# Patient Record
Sex: Female | Born: 1970
Health system: Southern US, Community
[De-identification: ages and names within clinical notes are randomized; demographics above are authoritative.]

## PROBLEM LIST (undated history)

## (undated) ENCOUNTER — Inpatient Hospital Stay (HOSPITAL_COMMUNITY): Payer: Self-pay | Admitting: Obstetrics and Gynecology

## (undated) DIAGNOSIS — R51 Headache: Secondary | ICD-10-CM

## (undated) DIAGNOSIS — Z79890 Hormone replacement therapy (postmenopausal): Secondary | ICD-10-CM

## (undated) DIAGNOSIS — R519 Headache, unspecified: Secondary | ICD-10-CM

## (undated) DIAGNOSIS — E119 Type 2 diabetes mellitus without complications: Secondary | ICD-10-CM

## (undated) DIAGNOSIS — Z87442 Personal history of urinary calculi: Secondary | ICD-10-CM

## (undated) DIAGNOSIS — E7800 Pure hypercholesterolemia, unspecified: Secondary | ICD-10-CM

## (undated) DIAGNOSIS — E039 Hypothyroidism, unspecified: Secondary | ICD-10-CM

## (undated) DIAGNOSIS — R7303 Prediabetes: Secondary | ICD-10-CM

## (undated) HISTORY — PX: BREAST SURGERY: SHX581

## (undated) HISTORY — DX: Pure hypercholesterolemia, unspecified: E78.00

## (undated) HISTORY — DX: Prediabetes: R73.03

---

## 2002-07-16 ENCOUNTER — Emergency Department (HOSPITAL_COMMUNITY): Admission: EM | Admit: 2002-07-16 | Discharge: 2002-07-17 | Payer: Self-pay | Admitting: Emergency Medicine

## 2005-01-30 ENCOUNTER — Encounter: Admission: RE | Admit: 2005-01-30 | Discharge: 2005-01-30 | Payer: Self-pay | Admitting: Occupational Medicine

## 2008-04-02 ENCOUNTER — Emergency Department (HOSPITAL_COMMUNITY): Admission: EM | Admit: 2008-04-02 | Discharge: 2008-04-02 | Payer: Self-pay | Admitting: Emergency Medicine

## 2008-12-24 ENCOUNTER — Emergency Department (HOSPITAL_COMMUNITY): Admission: EM | Admit: 2008-12-24 | Discharge: 2008-12-24 | Payer: Self-pay | Admitting: Family Medicine

## 2013-12-19 ENCOUNTER — Encounter (HOSPITAL_COMMUNITY): Payer: Self-pay | Admitting: Emergency Medicine

## 2013-12-19 ENCOUNTER — Emergency Department (HOSPITAL_COMMUNITY)
Admission: EM | Admit: 2013-12-19 | Discharge: 2013-12-19 | Disposition: A | Discharge status: Home / Self Care | Payer: BC Managed Care – PPO | Attending: Emergency Medicine | Admitting: Emergency Medicine

## 2013-12-19 DIAGNOSIS — R197 Diarrhea, unspecified: Secondary | ICD-10-CM | POA: Insufficient documentation

## 2013-12-19 DIAGNOSIS — Z3202 Encounter for pregnancy test, result negative: Secondary | ICD-10-CM | POA: Insufficient documentation

## 2013-12-19 DIAGNOSIS — R1110 Vomiting, unspecified: Secondary | ICD-10-CM

## 2013-12-19 DIAGNOSIS — R112 Nausea with vomiting, unspecified: Secondary | ICD-10-CM | POA: Insufficient documentation

## 2013-12-19 LAB — CBC WITH DIFFERENTIAL/PLATELET
Eosinophils Absolute: 0.1 10*3/uL (ref 0.0–0.7)
Eosinophils Relative: 1 % (ref 0–5)
HCT: 42.2 % (ref 36.0–46.0)
Hemoglobin: 14 g/dL (ref 12.0–15.0)
Lymphs Abs: 0.7 10*3/uL (ref 0.7–4.0)
MCH: 25.6 pg — ABNORMAL LOW (ref 26.0–34.0)
MCV: 77.3 fL — ABNORMAL LOW (ref 78.0–100.0)
Monocytes Absolute: 0.6 10*3/uL (ref 0.1–1.0)
Monocytes Relative: 5 % (ref 3–12)
RBC: 5.46 MIL/uL — ABNORMAL HIGH (ref 3.87–5.11)

## 2013-12-19 LAB — URINALYSIS, ROUTINE W REFLEX MICROSCOPIC
Ketones, ur: NEGATIVE mg/dL
Nitrite: NEGATIVE
pH: 8 (ref 5.0–8.0)

## 2013-12-19 LAB — COMPREHENSIVE METABOLIC PANEL
Alkaline Phosphatase: 71 U/L (ref 39–117)
BUN: 19 mg/dL (ref 6–23)
Calcium: 9.5 mg/dL (ref 8.4–10.5)
Creatinine, Ser: 0.79 mg/dL (ref 0.50–1.10)
GFR calc Af Amer: >90 mL/min (ref >90)
Glucose, Bld: 113 mg/dL — ABNORMAL HIGH (ref 70–99)
Potassium: 4.3 mEq/L (ref 3.5–5.1)
Total Protein: 7.6 g/dL (ref 6.0–8.3)

## 2013-12-19 LAB — URINE MICROSCOPIC-ADD ON

## 2013-12-19 MED ORDER — KETOROLAC TROMETHAMINE 30 MG/ML IJ SOLN
30.0000 mg | Freq: Once | INTRAMUSCULAR | Status: AC
  Administered 2013-12-19: 30 mg via INTRAMUSCULAR
  Filled 2013-12-19: qty 1

## 2013-12-19 MED ORDER — ONDANSETRON HCL 4 MG/2ML IJ SOLN: 4.0000 mg | Freq: Once | INTRAMUSCULAR | Status: DC

## 2013-12-19 MED ORDER — ONDANSETRON 4 MG PO TBDP
4.0000 mg | ORAL_TABLET | Freq: Once | ORAL | Status: AC
  Administered 2013-12-19: 4 mg via ORAL
  Filled 2013-12-19: qty 1

## 2013-12-19 MED ORDER — ONDANSETRON 4 MG PO TBDP: 4.0000 mg | ORAL_TABLET | Freq: Three times a day (TID) | ORAL | Status: DC | PRN

## 2013-12-19 NOTE — ED Provider Notes (Signed)
CSN: [id number]     Arrival date & time 12/19/13  0701 History   First MD Initiated Contact with Patient 12/19/13 [phone]     Chief Complaint  Patient presents with  . Nausea  . Emesis  . Diarrhea   (Consider location/radiation/quality/duration/timing/severity/associated sxs/prior Treatment) HPI  This a 42 year old female with no significant past medical history who presents with nausea, vomiting, and diarrhea. Onset of symptoms was this morning at 2 AM. Patient denies any abdominal pain. She denies any bloody stools. Emesis is nonbilious, nonbloody. Patient has had no known sick contacts. She denies fevers. She has had one week of cough and congestion.  Patient was concerned that she was unable to tolerate any fluids.  History reviewed. No pertinent past medical history. History reviewed. No pertinent past surgical history. No family history on file. History  Substance Use Topics  . Smoking status: Never Smoker   . Smokeless tobacco: Not on file  . Alcohol Use: Yes     Comment: social   OB History   Grav Para Term Preterm Abortions TAB SAB Ect Mult Living                 Review of Systems  Constitutional: Negative for fever.  Respiratory: Negative for cough, chest tightness and shortness of breath.   Cardiovascular: Negative for chest pain.  Gastrointestinal: Positive for nausea, vomiting and diarrhea. Negative for abdominal pain.  Genitourinary: Negative for dysuria.  Skin: Negative for rash.  All other systems reviewed and are negative.    Allergies  Review of patient's allergies indicates no known allergies.  Home Medications   Current Outpatient Rx  Name  Route  Sig  Dispense  Refill  . ibuprofen (ADVIL,MOTRIN) 200 MG tablet   Oral   Take 400 mg by mouth every 6 (six) hours as needed for fever or mild pain.         Marland Kitchen ondansetron (ZOFRAN-ODT) 4 MG disintegrating tablet   Oral   Take 1 tablet (4 mg total) by mouth every 8 (eight) hours as needed for nausea or  vomiting.   20 tablet   0    BP 113/73  Pulse 88  Temp(Src) 97.7 F (36.5 C) (Oral)  Resp 16  SpO2 100%  LMP 12/11/2013 Physical Exam  Nursing note and vitals reviewed. Constitutional: She is oriented to person, place, and time. No distress.  Ill-appearing but nontoxic  HENT:  Head: Normocephalic and atraumatic.  Mouth/Throat: Oropharynx is clear and moist.  Eyes: Pupils are equal, round, and reactive to light.  Neck: Neck supple.  Cardiovascular: Normal rate, regular rhythm and normal heart sounds.   No murmur heard. Pulmonary/Chest: Effort normal and breath sounds normal. No respiratory distress.  Abdominal: Soft. Bowel sounds are normal. There is no tenderness. There is no rebound and no guarding.  Neurological: She is alert and oriented to person, place, and time.  Skin: Skin is warm and dry.  Psychiatric: She has a normal mood and affect.    ED Course  Procedures (including critical care time) Labs Review Labs Reviewed  CBC WITH DIFFERENTIAL - Abnormal; Notable for the following:    WBC 11.5 (*)    RBC 5.46 (*)    MCV 77.3 (*)    MCH 25.6 (*)    Neutrophils Relative % 87 (*)    Neutro Abs 10.1 (*)    Lymphocytes Relative 6 (*)    All other components within normal limits  COMPREHENSIVE METABOLIC PANEL - Abnormal; Notable for the  following:    Glucose, Bld 113 (*)    All other components within normal limits  URINALYSIS, ROUTINE W REFLEX MICROSCOPIC - Abnormal; Notable for the following:    APPearance CLOUDY (*)    Hgb urine dipstick LARGE (*)    Protein, ur 30 (*)    Leukocytes, UA SMALL (*)    All other components within normal limits  URINE MICROSCOPIC-ADD ON  POCT PREGNANCY, URINE   Imaging Review No results found.  EKG Interpretation   None       MDM   1. Vomiting and diarrhea    Patient presents with vomiting and diarrhea onset this morning. She is ill-appearing but nontoxic. Lab work from triage has been reviewed and is largely  unremarkable. Urinalysis without evidence of UTI. Feel the patient symptoms are likely secondary to viral syndrome. Patient was given Zofran and was able to orally hydrate without vomiting. Discussed the patient precautions regarding dehydration.  Abdominal exam is benign.  After history, exam, and medical workup I feel the patient has been appropriately medically screened and is safe for discharge home. Pertinent diagnoses were discussed with the patient. Patient was given return precautions.     Shon Baton, MD 12/19/13 1101

## 2013-12-19 NOTE — ED Notes (Signed)
Reminded pt of the need for a urine sample and encouraged fluids. Pt now c/o  Headache.

## 2013-12-19 NOTE — ED Notes (Signed)
Pt aware of the need for a urine sample. 

## 2013-12-19 NOTE — ED Notes (Signed)
This morning woke about 0200 with vomiting and diarrhea, has vomited about every hour since.  Diarrhea x 2

## 2016-11-29 ENCOUNTER — Emergency Department (HOSPITAL_BASED_OUTPATIENT_CLINIC_OR_DEPARTMENT_OTHER): Payer: 59

## 2016-11-29 ENCOUNTER — Inpatient Hospital Stay (HOSPITAL_BASED_OUTPATIENT_CLINIC_OR_DEPARTMENT_OTHER)
Admission: EM | Admit: 2016-11-29 | Discharge: 2016-11-30 | Disposition: A | Discharge status: Home / Self Care | Payer: 59 | Attending: Obstetrics and Gynecology | Admitting: Obstetrics and Gynecology

## 2016-11-29 ENCOUNTER — Encounter (HOSPITAL_BASED_OUTPATIENT_CLINIC_OR_DEPARTMENT_OTHER): Payer: Self-pay | Admitting: *Deleted

## 2016-11-29 DIAGNOSIS — N839 Noninflammatory disorder of ovary, fallopian tube and broad ligament, unspecified: Secondary | ICD-10-CM | POA: Insufficient documentation

## 2016-11-29 DIAGNOSIS — R1900 Intra-abdominal and pelvic swelling, mass and lump, unspecified site: Secondary | ICD-10-CM | POA: Diagnosis not present

## 2016-11-29 DIAGNOSIS — R102 Pelvic and perineal pain: Secondary | ICD-10-CM | POA: Diagnosis present

## 2016-11-29 DIAGNOSIS — R109 Unspecified abdominal pain: Secondary | ICD-10-CM

## 2016-11-29 DIAGNOSIS — N838 Other noninflammatory disorders of ovary, fallopian tube and broad ligament: Secondary | ICD-10-CM

## 2016-11-29 LAB — CBC WITH DIFFERENTIAL/PLATELET
BASOS ABS: 0 10*3/uL (ref 0.0–0.1)
Basophils Relative: 0 %
EOS PCT: 2 %
Eosinophils Absolute: 0.3 10*3/uL (ref 0.0–0.7)
HCT: 39.6 % (ref 36.0–46.0)
Hemoglobin: 13.1 g/dL (ref 12.0–15.0)
LYMPHS PCT: 26 %
Lymphs Abs: 2.9 10*3/uL (ref 0.7–4.0)
MCH: 26.4 pg (ref 26.0–34.0)
MCHC: 33.1 g/dL (ref 30.0–36.0)
MCV: 79.8 fL (ref 78.0–100.0)
Monocytes Absolute: 0.9 10*3/uL (ref 0.1–1.0)
Monocytes Relative: 8 %
NEUTROS ABS: 6.9 10*3/uL (ref 1.7–7.7)
Neutrophils Relative %: 64 %
PLATELETS: 388 10*3/uL (ref 150–400)
RBC: 4.96 MIL/uL (ref 3.87–5.11)
RDW: 14.1 % (ref 11.5–15.5)
WBC: 11 10*3/uL — AB (ref 4.0–10.5)

## 2016-11-29 LAB — BASIC METABOLIC PANEL
ANION GAP: 13 (ref 5–15)
BUN: 23 mg/dL — ABNORMAL HIGH (ref 6–20)
CO2: 21 mmol/L — ABNORMAL LOW (ref 22–32)
Calcium: 9.6 mg/dL (ref 8.9–10.3)
Chloride: 103 mmol/L (ref 101–111)
Creatinine, Ser: 0.98 mg/dL (ref 0.44–1.00)
GFR calc Af Amer: >60 mL/min (ref >60)
GLUCOSE: 113 mg/dL — AB (ref 65–99)
POTASSIUM: 4.4 mmol/L (ref 3.5–5.1)
Sodium: 137 mmol/L (ref 135–145)

## 2016-11-29 LAB — URINALYSIS, ROUTINE W REFLEX MICROSCOPIC
Bilirubin Urine: NEGATIVE
Glucose, UA: NEGATIVE mg/dL
HGB URINE DIPSTICK: NEGATIVE
Ketones, ur: NEGATIVE mg/dL
LEUKOCYTES UA: NEGATIVE
NITRITE: NEGATIVE
PROTEIN: NEGATIVE mg/dL
Specific Gravity, Urine: 1.024 (ref 1.005–1.030)
pH: 6 (ref 5.0–8.0)

## 2016-11-29 LAB — PREGNANCY, URINE: PREG TEST UR: NEGATIVE

## 2016-11-29 MED ORDER — MORPHINE SULFATE (PF) 4 MG/ML IV SOLN
INTRAVENOUS | Status: AC
  Administered 2016-11-29: 4 mg
  Filled 2016-11-29: qty 1

## 2016-11-29 MED ORDER — KETOROLAC TROMETHAMINE 30 MG/ML IJ SOLN
INTRAMUSCULAR | Status: AC
  Administered 2016-11-29: 30 mg via INTRAVENOUS
  Filled 2016-11-29: qty 1

## 2016-11-29 MED ORDER — KETOROLAC TROMETHAMINE 30 MG/ML IJ SOLN
30.0000 mg | Freq: Once | INTRAMUSCULAR | Status: AC
  Administered 2016-11-29: 30 mg via INTRAVENOUS

## 2016-11-29 MED ORDER — SODIUM CHLORIDE 0.9 % IV SOLN
INTRAVENOUS | Status: DC
  Administered 2016-11-29: via INTRAVENOUS

## 2016-11-29 MED ORDER — ONDANSETRON HCL 4 MG/2ML IJ SOLN
INTRAMUSCULAR | Status: AC
  Administered 2016-11-29: 4 mg
  Filled 2016-11-29: qty 2

## 2016-11-29 MED ORDER — HYDROMORPHONE HCL 1 MG/ML IJ SOLN
1.0000 mg | Freq: Once | INTRAMUSCULAR | Status: AC
  Administered 2016-11-29: 1 mg via INTRAVENOUS
  Filled 2016-11-29: qty 1

## 2016-11-29 NOTE — ED Triage Notes (Signed)
Pt c/o sudden onset of  left flank pain x 30 mins ago.

## 2016-11-29 NOTE — ED Provider Notes (Signed)
By signing my name below, I, Diana Long, attest that this documentation has been prepared under the direction and in the presence of Biddle, DO . Electronically Signed: Dolores Long, Scribe. 11/29/2016. 11:05 PM.  TIME SEEN: 11:29PM  CHIEF COMPLAINT: Flank Pain  HPI:   Diana Long is a 45 y.o. female who presents to the Emergency Department complaining of sudden-onset constant lower left sided back pain beginning earlier today that started just prior to arrival. Described a sharp, severe in nature and radiates into the left pelvic region.. Pt has not tried any OTC pain killers for her symptoms. No modifying factors indicated. She reports associated nausea. Pt denies any vomiting, diarrhea, dysuria, hematuria, vaginal bleeding, vaginal discharge or recent injury to her back. Has never had similar symptoms. No history of kidney stones.   ROS: See HPI Constitutional: no fever  Eyes: no drainage  ENT: no runny nose   Cardiovascular:  no chest pain  Resp: no SOB  GI: no vomiting. no diarrhea.  GU: flank pain. no dysuria. no hematuria. no vaginal bleeding. no vaginal discharge. Integumentary: no rash  Allergy: no hives  Musculoskeletal: no leg swelling  Neurological: no slurred speech ROS otherwise negative  PAST MEDICAL HISTORY/PAST SURGICAL HISTORY:  History reviewed. No pertinent past medical history.  MEDICATIONS:  Prior to Admission medications   Medication Sig Start Date End Date Taking? Authorizing Provider  ferrous sulfate 325 (65 FE) MG tablet Take 325 mg by mouth daily with breakfast.   Yes Historical Provider, MD  ibuprofen (ADVIL,MOTRIN) 200 MG tablet Take 400 mg by mouth every 6 (six) hours as needed for fever or mild pain.    Historical Provider, MD  ondansetron (ZOFRAN-ODT) 4 MG disintegrating tablet Take 1 tablet (4 mg total) by mouth every 8 (eight) hours as needed for nausea or vomiting. 12/19/13   Merryl Hacker, MD    ALLERGIES:  No Known  Allergies  SOCIAL HISTORY:  Social History  Substance Use Topics  . Smoking status: Never Smoker  . Smokeless tobacco: Not on file  . Alcohol use Yes     Comment: social    FAMILY HISTORY: No family history on file.  EXAM: BP 125/80 (BP Location: Right Arm)   Pulse 81   Temp 97.7 F (36.5 C) (Oral)   Resp 22   Ht 5\' 5"  (1.651 m)   Wt 200 lb (90.7 kg)   LMP 11/09/2016   SpO2 100%   BMI 33.28 kg/m  CONSTITUTIONAL: Alert and oriented and responds appropriately to questions. Well-nourished. Appears uncomfortable.  Afebrile. Nontoxic. HEAD: Normocephalic EYES: Conjunctivae clear, PERRL, EOMI ENT: normal nose; no rhinorrhea; moist mucous membranes NECK: Supple, no meningismus, no nuchal rigidity, no LAD  CARD: RRR; S1 and S2 appreciated; no murmurs, no clicks, no rubs, no gallops RESP: Normal chest excursion without splinting or tachypnea; breath sounds clear and equal bilaterally; no wheezes, no rhonchi, no rales, no hypoxia or respiratory distress, speaking full sentences ABD/GI: Normal bowel sounds; non-distended; soft, non-tender, no rebound, no guarding, no peritoneal signs, no hepatosplenomegaly GU:  Normal external genitalia. No lesions, rashes noted. Patient has no vaginal bleeding on exam. No significant vaginal discharge.  No right adnexal tenderness, mass or fullness, no cervical motion tenderness. Patient does have left adnexal tenderness and has fullness on the side but exam is very limited due to patient's pain. Cervix is not appear friable.  Cervix is closed.  Chaperone present for exam. BACK:  The back appears normal and is  non-tender to palpation, there is no CVA tenderness EXT: Normal ROM in all joints; non-tender to palpation; no edema; normal capillary refill; no cyanosis, no calf tenderness or swelling    SKIN: Normal color for age and race; warm; no rash NEURO: Moves all extremities equally, sensation to light touch intact diffusely, cranial nerves II through XII  intact, normal speech PSYCH: The patient's mood and manner are appropriate. Grooming and personal hygiene are appropriate.  MEDICAL DECISION MAKING: Patient here with sudden onset left flank pain that radiates into her left lower quadrant. Initial concern was for kidney stone. CT scan obtained in triage. Urine shows no blood or sign of infection. Pregnancy test negative. No improvement with IV morphine. We'll give Toradol, Dilaudid.  ED PROGRESS: CT scan shows a large midline pelvic mass measuring 12 cm that may be arising from the left ovary. I am not concerned for ovarian torsion. Will perform pelvic exam with cultures. She states she has never been sexually active but has had a pelvic exam before. No history of pregnancies, STDs. No history of uterine, ovarian cancer for herself or family. Does report family history of breast cancer. States her OB/GYN is Dr. Melba Coon with [facility].  CT scan shows nonobstructing left nephrolithiasis but no ureterolithiasis, hydronephrosis.   11:40 PM  Spoke with Dr. Ilda Basset at [facility] for an accepting physician as patient needs to come emergency traffic for evaluation.  Page has been placed to her OBGYN with [id number], Dr. Melba Coon.  EMS notified.  11:55 AM  Spoke with Dr. Melba Coon.  She agrees to accept the patient in transfer to MAU for an emergent [place].  Pt is NPO.  Patient and mother updated with plan.     I reviewed all nursing notes, vitals, pertinent old records, EKGs, labs, imaging (as available).   I personally performed the services described in this documentation, which was scribed in my presence. The recorded information has been reviewed and is accurate.     Baker, DO 11/30/16 0002

## 2016-11-29 NOTE — ED Notes (Signed)
Patient to be transferred to [facility].

## 2016-11-30 ENCOUNTER — Inpatient Hospital Stay (HOSPITAL_COMMUNITY): Payer: 59

## 2016-11-30 ENCOUNTER — Telehealth: Payer: Self-pay

## 2016-11-30 ENCOUNTER — Encounter (HOSPITAL_COMMUNITY): Payer: Self-pay | Admitting: *Deleted

## 2016-11-30 DIAGNOSIS — R1900 Intra-abdominal and pelvic swelling, mass and lump, unspecified site: Secondary | ICD-10-CM

## 2016-11-30 DIAGNOSIS — N839 Noninflammatory disorder of ovary, fallopian tube and broad ligament, unspecified: Secondary | ICD-10-CM | POA: Diagnosis not present

## 2016-11-30 LAB — WET PREP, GENITAL
Clue Cells Wet Prep HPF POC: NONE SEEN
Sperm: NONE SEEN
Trich, Wet Prep: NONE SEEN
WBC WET PREP: NONE SEEN
Yeast Wet Prep HPF POC: NONE SEEN

## 2016-11-30 LAB — GC/CHLAMYDIA PROBE AMP (~~LOC~~) NOT AT ARMC
Chlamydia: NEGATIVE
Neisseria Gonorrhea: NEGATIVE

## 2016-11-30 MED ORDER — OXYCODONE-ACETAMINOPHEN 5-325 MG PO TABS: 1.0000 | ORAL_TABLET | ORAL | 0 refills | Status: DC | PRN

## 2016-11-30 MED ORDER — PROMETHAZINE HCL 25 MG PO TABS: 12.5000 mg | ORAL_TABLET | Freq: Four times a day (QID) | ORAL | 0 refills | Status: DC | PRN

## 2016-11-30 MED ORDER — PROMETHAZINE HCL 25 MG PO TABS
25.0000 mg | ORAL_TABLET | Freq: Once | ORAL | Status: AC
  Administered 2016-11-30: 25 mg via ORAL
  Filled 2016-11-30: qty 1

## 2016-11-30 MED ORDER — OXYCODONE-ACETAMINOPHEN 5-325 MG PO TABS
2.0000 | ORAL_TABLET | Freq: Once | ORAL | Status: AC
  Administered 2016-11-30: 2 via ORAL
  Filled 2016-11-30: qty 2

## 2016-11-30 NOTE — Telephone Encounter (Signed)
Notified pt of appt with Dr Denman George for 12/10/16 at 9:30am, records requested from Dr Tereso Newcomer,

## 2016-11-30 NOTE — ED Notes (Signed)
Patient transferred to EMS  - patient with decreased O2 sats intermittently to 90% - the patient then took deep breaths and o2 sats to 94% - EMS aware.

## 2016-11-30 NOTE — MAU Provider Note (Signed)
History     CSN: [id number]  Arrival date and time: 11/29/16 2205   None     Chief Complaint  Patient presents with  . Pelvic Pain   Pelvic Pain  The patient's primary symptoms include pelvic pain. This is a new problem. The current episode started yesterday. The problem occurs constantly. The problem has been gradually improving. Pain severity now: 2/10 currently  The problem affects both sides. She is not pregnant. Associated symptoms include abdominal pain, flank pain, nausea and vomiting. Pertinent negatives include no chills, constipation, diarrhea or fever. The vaginal discharge was normal. There has been no bleeding. Nothing aggravates the symptoms. She has tried nothing for the symptoms. She is not sexually active. She uses abstinence for contraception. Menstrual history: LMP: 11/09/16    Past Medical History:  Diagnosis Date  . Medical history non-contributory     Past Surgical History:  Procedure Laterality Date  . NO PAST SURGERIES      History reviewed. No pertinent family history.  Social History  Substance Use Topics  . Smoking status: Never Smoker  . Smokeless tobacco: Never Used  . Alcohol use Yes     Comment: social    Allergies: No Known Allergies  Prescriptions Prior to Admission  Medication Sig Dispense Refill Last Dose  . ferrous sulfate 325 (65 FE) MG tablet Take 325 mg by mouth daily with breakfast.     . ibuprofen (ADVIL,MOTRIN) 200 MG tablet Take 400 mg by mouth every 6 (six) hours as needed for fever or mild pain.   12/18/2013 at Unknown time  . ondansetron (ZOFRAN-ODT) 4 MG disintegrating tablet Take 1 tablet (4 mg total) by mouth every 8 (eight) hours as needed for nausea or vomiting. 20 tablet 0     Review of Systems  Constitutional: Negative for chills and fever.  Gastrointestinal: Positive for abdominal pain, nausea and vomiting. Negative for constipation and diarrhea.  Genitourinary: Positive for flank pain and pelvic pain.   Physical  Exam   Blood pressure 127/70, pulse 73, temperature 98.7 F (37.1 C), temperature source Oral, resp. rate 17, height 5\' 5"  (1.651 m), weight 200 lb (90.7 kg), last menstrual period 11/09/2016, SpO2 96 %.  Physical Exam  Nursing note and vitals reviewed. Constitutional: She is oriented to person, place, and time. She appears well-developed and well-nourished. No distress.  HENT:  Head: Normocephalic.  Cardiovascular: Normal rate.   Respiratory: Effort normal.  GI: Soft. She exhibits mass. There is no tenderness. There is no rebound.  Large mass palpable to the left of the umbilicus.   Neurological: She is alert and oriented to person, place, and time.  Skin: Skin is warm and dry.  Psychiatric: She has a normal mood and affect.   Results for orders placed or performed during the hospital encounter of 11/29/16 (from the past 24 hour(s))  Pregnancy, urine     Status: None   Collection Time: 11/29/16 10:15 PM  Result Value Ref Range   Preg Test, Ur NEGATIVE NEGATIVE  Urinalysis, Routine w reflex microscopic     Status: Abnormal   Collection Time: 11/29/16 10:15 PM  Result Value Ref Range   Color, Urine YELLOW YELLOW   APPearance CLOUDY (A) CLEAR   Specific Gravity, Urine 1.024 1.005 - 1.030   pH 6.0 5.0 - 8.0   Glucose, UA NEGATIVE NEGATIVE mg/dL   Hgb urine dipstick NEGATIVE NEGATIVE   Bilirubin Urine NEGATIVE NEGATIVE   Ketones, ur NEGATIVE NEGATIVE mg/dL   Protein, ur NEGATIVE  NEGATIVE mg/dL   Nitrite NEGATIVE NEGATIVE   Leukocytes, UA NEGATIVE NEGATIVE  CBC with Differential     Status: Abnormal   Collection Time: 11/29/16 11:25 PM  Result Value Ref Range   WBC 11.0 (H) 4.0 - 10.5 K/uL   RBC 4.96 3.87 - 5.11 MIL/uL   Hemoglobin 13.1 12.0 - 15.0 g/dL   HCT 39.6 36.0 - 46.0 %   MCV 79.8 78.0 - 100.0 fL   MCH 26.4 26.0 - 34.0 pg   MCHC 33.1 30.0 - 36.0 g/dL   RDW 14.1 11.5 - 15.5 %   Platelets 388 150 - 400 K/uL   Neutrophils Relative % 64 %   Neutro Abs 6.9 1.7 - 7.7  K/uL   Lymphocytes Relative 26 %   Lymphs Abs 2.9 0.7 - 4.0 K/uL   Monocytes Relative 8 %   Monocytes Absolute 0.9 0.1 - 1.0 K/uL   Eosinophils Relative 2 %   Eosinophils Absolute 0.3 0.0 - 0.7 K/uL   Basophils Relative 0 %   Basophils Absolute 0.0 0.0 - 0.1 K/uL  Basic metabolic panel     Status: Abnormal   Collection Time: 11/29/16 11:25 PM  Result Value Ref Range   Sodium 137 135 - 145 mmol/L   Potassium 4.4 3.5 - 5.1 mmol/L   Chloride 103 101 - 111 mmol/L   CO2 21 (L) 22 - 32 mmol/L   Glucose, Bld 113 (H) 65 - 99 mg/dL   BUN 23 (H) 6 - 20 mg/dL   Creatinine, Ser 0.98 0.44 - 1.00 mg/dL   Calcium 9.6 8.9 - 10.3 mg/dL   GFR calc non Af Amer >60 >60 mL/min   GFR calc Af Amer >60 >60 mL/min   Anion gap 13 5 - 15  Wet prep, genital     Status: None   Collection Time: 11/30/16 12:10 AM  Result Value Ref Range   Yeast Wet Prep HPF POC NONE SEEN NONE SEEN   Trich, Wet Prep NONE SEEN NONE SEEN   Clue Cells Wet Prep HPF POC NONE SEEN NONE SEEN   WBC, Wet Prep HPF POC NONE SEEN NONE SEEN   Sperm NONE SEEN    US Pelvis Complete  Result Date: 11/30/2016 CLINICAL DATA:  Acute onset flank and pelvic pain. Mass observed on CT. EXAM: TRANSABDOMINAL ULTRASOUND OF PELVIS TECHNIQUE: Transabdominal ultrasound examination of the pelvis was performed including evaluation of the uterus, ovaries, adnexal regions, and pelvic cul-de-sac. COMPARISON:  CT 11/29/2016 FINDINGS: Uterus Measurements: 10.0 x 5.0 x 5.3 cm. 2.6 cm central uterine mass, either an endometrial neoplasm/polyp or a submucosal fibroid. There also is a 1.9 cm fibroid at the right lateral midbody. Endometrium Thickness: 2.1 cm. Right ovary Not conclusively identified separate from a large complex cystic mass in the right hemipelvis. The mass measures over 12 cm and exhibits very prominent mural soft tissue thickening with papillary appearances there are separate complex cystic structures measuring 3-5 cm a little more posteriorly in the  right adnexal region. Left ovary Not visible. No free pelvic fluid is evident. IMPRESSION: 1. 12 cm complex cystic mass in the right hemipelvis and low abdominal midline, exhibiting marked mural thickening and papillary projections. Suspicious for neoplasm. Recommend gynecologic referral for probable ovarian neoplasm. 2. At least 2 additional complex cysts are visible more posteriorly in the right adnexal region. 3. No normal ovarian tissue is visible on either side. 4. 2.6 cm central uterine mass, either an endometrial lesion or a submucosal fibroid. 5. No ascites.  6. These results will be called to the ordering clinician or representative by the Radiologist Assistant, and communication documented in the PACS or zVision Dashboard. Electronically Signed   By: Andreas Newport M.D.   On: 11/30/2016 02:31   Ct Renal Stone Study  Result Date: 11/29/2016 CLINICAL DATA:  Sudden onset of left flank pain today. EXAM: CT ABDOMEN AND PELVIS WITHOUT CONTRAST TECHNIQUE: Multidetector CT imaging of the abdomen and pelvis was performed following the standard protocol without IV contrast. COMPARISON:  None. FINDINGS: Lower chest: The lung bases are clear. Hepatobiliary: 18 mm subcapsular hypodensity in the inferior right lobe of the liver is incompletely characterized without contrast. Probable fatty infiltration adjacent the gallbladder fossa. Gallbladder physiologically distended, no calcified stone. No biliary dilatation. Pancreas: Unremarkable. No pancreatic ductal dilatation or surrounding inflammatory changes. Spleen: Normal in size without focal abnormality. Adrenals/Urinary Tract: Nonobstructing 8 x 9 mm stone in the lower pole of the left kidney. No additional urolithiasis. No hydronephrosis or perinephric edema. Ureters are decompressed. Urinary bladder is decompressed. Stomach/Bowel: Stomach is distended with ingested contents. Appendix appears normal. No evidence of bowel wall thickening, distention, or  inflammatory changes. Vascular/Lymphatic: Small mesenteric nodes in the ileocolic chain. No retroperitoneal or pelvic adenopathy. No vascular abnormality. Reproductive: Large midline pelvic mass may be arising from the left ovary, measuring 11 x 12 x 9 cm. This measures soft tissue density. Question of adjacent free fluid or inflammation. Tentatively identified right ovary is enlarged measuring 6.0 x 5.1 x 5.4 cm. There is a small calcified density between the midline pelvic mass and right ovary. The uterus is prominent size, bulbous and heterogeneous with soft tissue density causing mass effect on fluid in the endometrial canal. Other: Other than the small free fluid adjacent to midline mass, no free fluid. No free air. Musculoskeletal: There are no acute or suspicious osseous abnormalities. Mild facet degeneration. IMPRESSION: 1. Large midline pelvic mass measuring at least 12 cm, the etiology is uncertain, however may be arising from the left ovary versus less likely uterine fibroid. Tentatively identified right ovary is also prominent size. Recommend pelvic ultrasound with Doppler for characterization and evaluate for ovarian torsion, however large size of the pelvic mass may limit sonographic sensitivity. 2. Bulbous uterus with possible fibroids separate from the pelvic mass. 3. Nonobstructing left nephrolithiasis. These results were called by telephone at the time of interpretation on 11/29/2016 at 11:25 pm to Dr. Gareth Morgan , who verbally acknowledged these results. Electronically Signed   By: Jeb Levering M.D.   On: 11/29/2016 23:26    MAU Course  Procedures  MDM 0245: D/W Dr. Melba Coon, she will come in and review results with the patient.   Assessment and Plan   1. Left flank pain   2. Pelvic mass   3. Ovarian mass    DC home Comfort measures reviewed  RX: percocet PRN, phenergan PRN  Return to MAU as needed Bovard will set her up with GYN-ONC   Follow-up Information     Bovard-Stuckert, Jody, MD Follow up.   Specialty:  Obstetrics and Gynecology Contact information: [address] [place] [address] [phone]            [place], Ognibene 11/30/2016, 2:06 AM

## 2016-11-30 NOTE — MAU Note (Signed)
Pt arrives via EMS from [facility]. States she presented there with sudden onset of sharp flank pain and pelvic pain. ( C/T scan there showed pelvic mass) , sent here for further eval.

## 2016-11-30 NOTE — MAU Provider Note (Signed)
  Pt seen at [facility], transferred here with mass noted on CT.  Concern for torsed ovary.  Pt presented with severe pain.  On [place] with large complex cystic and solid mass R hemipelvis with papillary projections.  Also mass in uterus, ?fibroid - 6cm  PMH none PSH none POBGYNHx G0 No abn pap No STD Neg My risk At last annual some BTB - pt to monitor sx's d/w her EMB and hysteroscopy D&C Meds none All NKDA SH no tob, occ, rare ETOH, no drugs; works for [employer] [facility] [employer]  Called to [place] - will try to get into clinic today  (Pt# [phone])  ROS: Pt with some N/V, no dysuria, no early satiety, no diarrhea and constipation.  Had low ferritin  PE  gen NAD Refer to MAU note  A&P Referral to GYN ONC, d/w pt likely malignancy

## 2016-12-10 ENCOUNTER — Ambulatory Visit: Payer: 59 | Attending: Gynecologic Oncology | Admitting: Gynecologic Oncology

## 2016-12-10 ENCOUNTER — Encounter: Payer: Self-pay | Admitting: Gynecologic Oncology

## 2016-12-10 ENCOUNTER — Ambulatory Visit (HOSPITAL_BASED_OUTPATIENT_CLINIC_OR_DEPARTMENT_OTHER): Payer: 59

## 2016-12-10 VITALS — BP 145/78 | HR 111 | Temp 98.6°F | Resp 18 | Ht 65.0 in | Wt 202.9 lb

## 2016-12-10 DIAGNOSIS — N839 Noninflammatory disorder of ovary, fallopian tube and broad ligament, unspecified: Secondary | ICD-10-CM | POA: Insufficient documentation

## 2016-12-10 DIAGNOSIS — Z803 Family history of malignant neoplasm of breast: Secondary | ICD-10-CM | POA: Diagnosis not present

## 2016-12-10 DIAGNOSIS — N838 Other noninflammatory disorders of ovary, fallopian tube and broad ligament: Secondary | ICD-10-CM

## 2016-12-10 DIAGNOSIS — Z79899 Other long term (current) drug therapy: Secondary | ICD-10-CM | POA: Diagnosis not present

## 2016-12-10 DIAGNOSIS — N939 Abnormal uterine and vaginal bleeding, unspecified: Secondary | ICD-10-CM | POA: Insufficient documentation

## 2016-12-10 DIAGNOSIS — N858 Other specified noninflammatory disorders of uterus: Secondary | ICD-10-CM

## 2016-12-10 DIAGNOSIS — N859 Noninflammatory disorder of uterus, unspecified: Secondary | ICD-10-CM | POA: Diagnosis present

## 2016-12-10 NOTE — Patient Instructions (Addendum)
Preparing for your Surgery  Plan for surgery on December 20, 2016 with Dr. Everitt Amber at [facility] will be scheduled for open (exploratory laparotomy) total hysterectomy, right salpingo-oophorectomy, possible bilateral salpingo-oophorectomy, possible staging.  Pre-operative Testing -You will receive a phone call from presurgical testing at [facility] to arrange for a pre-operative testing appointment before your surgery.  This appointment normally occurs one to two weeks before your scheduled surgery.   -Bring your insurance card, copy of an advanced directive if applicable, medication list  -At that visit, you will be asked to sign a consent for a possible blood transfusion in case a transfusion becomes necessary during surgery.  The need for a blood transfusion is rare but having consent is a necessary part of your care.     -You should not be taking blood thinners or aspirin at least ten days prior to surgery unless instructed by your surgeon.  Day Before Surgery at [facility] will be asked to take in a light diet the day before surgery.  Avoid carbonated beverages.  You will be advised to have nothing to eat or drink after midnight the evening before.     Eat a light diet the day before surgery.  Examples including soups, broths, toast, yogurt, mashed potatoes.  Things to avoid include carbonated beverages (fizzy beverages), raw fruits and raw vegetables, or beans.    If your bowels are filled with gas, your surgeon will have difficulty visualizing your pelvic organs which increases your surgical risks.  Your role in recovery Your role is to become active as soon as directed by your doctor, while still giving yourself time to heal.  Rest when you feel tired. You will be asked to do the following in order to speed your recovery:  - Cough and breathe deeply. This helps toclear and expand your lungs and can prevent pneumonia. You may be given a  spirometer to practice deep breathing. A staff member will show you how to use the spirometer. - Do mild physical activity. Walking or moving your legs help your circulation and body functions return to normal. A staff member will help you when you try to walk and will provide you with simple exercises. Do not try to get up or walk alone the first time. - Actively manage your pain. Managing your pain lets you move in comfort. We will ask you to rate your pain on a scale of zero to 10. It is your responsibility to tell your doctor or nurse where and how much you hurt so your pain can be treated.  Special Considerations -If you are diabetic, you may be placed on insulin after surgery to have closer control over your blood sugars to promote healing and recovery.  This does not mean that you will be discharged on insulin.  If applicable, your oral antidiabetics will be resumed when you are tolerating a solid diet.  -Your final pathology results from surgery should be available by the Friday after surgery and the results will be relayed to you when available.   Blood Transfusion Information WHAT IS A BLOOD TRANSFUSION? A transfusion is the replacement of blood or some of its parts. Blood is made up of multiple cells which provide different functions.  Red blood cells carry oxygen and are used for blood loss replacement.  White blood cells fight against infection.  Platelets control bleeding.  Plasma helps clot blood.  Other blood products are available for specialized needs, such as  hemophilia or other clotting disorders. BEFORE THE TRANSFUSION  Who gives blood for transfusions?   You may be able to donate blood to be used at a later date on yourself (autologous donation).  Relatives can be asked to donate blood. This is generally not any safer than if you have received blood from a stranger. The same precautions are taken to ensure safety when a relative's blood is donated.  Healthy  volunteers who are fully evaluated to make sure their blood is safe. This is blood bank blood. Transfusion therapy is the safest it has ever been in the practice of medicine. Before blood is taken from a donor, a complete history is taken to make sure that person has no history of diseases nor engages in risky social behavior (examples are intravenous drug use or sexual activity with multiple partners). The donor's travel history is screened to minimize risk of transmitting infections, such as malaria. The donated blood is tested for signs of infectious diseases, such as HIV and hepatitis. The blood is then tested to be sure it is compatible with you in order to minimize the chance of a transfusion reaction. If you or a relative donates blood, this is often done in anticipation of surgery and is not appropriate for emergency situations. It takes many days to process the donated blood. RISKS AND COMPLICATIONS Although transfusion therapy is very safe and saves many lives, the main dangers of transfusion include:   Getting an infectious disease.  Developing a transfusion reaction. This is an allergic reaction to something in the blood you were given. Every precaution is taken to prevent this. The decision to have a blood transfusion has been considered carefully by your caregiver before blood is given. Blood is not given unless the benefits outweigh the risks.

## 2016-12-10 NOTE — Progress Notes (Signed)
Consult Note: Gyn-Onc  Consult was requested by Dr. Melba Coon for the evaluation of Diana Long 45 y.o. female  CC:  Chief Complaint  Patient presents with  . uterine mass    Assessment/Plan:  Diana Long  is a 45 y.o.  year old with a large (12cm) solid and cystic right ovarian mass and abnormal uterine bleeding.  She declined biopsy of the endometrium today due to virginal status.  Recommend surgery with ex lap, RSO, possible hysterectomy (vs D&C) and LSO with staging if malignancy is identified. I discussed that if hysterectomy is performed she will no longer be able to carry a pregnancy.  I discussed operative risks including  bleeding, infection, damage to internal organs (such as bladder,ureters, bowels), blood clot, reoperation and rehospitalization.  Surgery is scheduled for 12/20/16.   HPI: Diana Long is a 45 year old G32 virginal woman who is seen in consultation at the request of Dr Melba Coon for a large right ovarian mass.  The patient experienced severe sudden onset left abdominal pains in early December 2017. She was seen at [facility] where CT imaging and US of the pelvis confirmed a uterus measuring 10x5x5.3cm and a 2cm right lateral mid body fibroid. THere was a 12cm right ovarian mass which was complex and cystic and contained mural soft tissue thickening with papillary appearance. No free fluid, omental disease, carcinomatosis or lymphadenopathy was seen.   Her pain resolved within 12 hours and she has been painfree since.  She reports some abnormal irregular vaginal bleeding in the past year which has normalized more recently. She has never been pregnant nor has she been sexually active. She is very healthy and has had no prior abdominal surgeries. She has a family history of breast cancer, but has been personally BRCA tested and was negative.   Current Meds:  Outpatient Encounter Prescriptions as of 12/10/2016  Medication Sig  . ferrous  sulfate 325 (65 FE) MG tablet Take 325 mg by mouth daily with breakfast.  . ibuprofen (ADVIL,MOTRIN) 200 MG tablet Take 400 mg by mouth every 6 (six) hours as needed for fever or mild pain.  . vitamin C (ASCORBIC ACID) 500 MG tablet Take 500 mg by mouth.  . [DISCONTINUED] ondansetron (ZOFRAN-ODT) 4 MG disintegrating tablet Take 1 tablet (4 mg total) by mouth every 8 (eight) hours as needed for nausea or vomiting. (Patient not taking: Reported on 12/10/2016)  . [DISCONTINUED] oxyCODONE-acetaminophen (PERCOCET/ROXICET) 5-325 MG tablet Take 1-2 tablets by mouth every 4 (four) hours as needed for severe pain. (Patient not taking: Reported on 12/10/2016)  . [DISCONTINUED] promethazine (PHENERGAN) 25 MG tablet Take 0.5-1 tablets (12.5-25 mg total) by mouth every 6 (six) hours as needed. (Patient not taking: Reported on 12/10/2016)   No facility-administered encounter medications on file as of 12/10/2016.     Allergy: No Known Allergies  Social Hx:   Social History   Social History  . Marital status: Single    Spouse name: N/A  . Number of children: N/A  . Years of education: N/A   Occupational History  . Not on file.   Social History Main Topics  . Smoking status: Never Smoker  . Smokeless tobacco: Never Used  . Alcohol use Not on file     Comment: social  . Drug use: Unknown  . Sexual activity: Yes    Birth control/ protection: None   Other Topics Concern  . Not on file   Social History Narrative  . No narrative  on file    Past Surgical Hx:  Past Surgical History:  Procedure Laterality Date  . NO PAST SURGERIES      Past Medical Hx:  Past Medical History:  Diagnosis Date  . Medical history non-contributory     Past Gynecological History:   Patient's last menstrual period was 11/09/2016.  Family Hx: History reviewed. No pertinent family history.  Review of Systems:  Constitutional  Feels well,    ENT Normal appearing ears and nares bilaterally Skin/Breast  No  rash, sores, jaundice, itching, dryness Cardiovascular  No chest pain, shortness of breath, or edema  Pulmonary  No cough or wheeze.  Gastro Intestinal  No nausea, vomitting, or diarrhoea. No bright red blood per rectum, no abdominal pain, change in bowel movement, or constipation.  Genito Urinary  No frequency, urgency, dysuria, no pain. Musculo Skeletal  No myalgia, arthralgia, joint swelling or pain  Neurologic  No weakness, numbness, change in gait,  Psychology  No depression, anxiety, insomnia.   Vitals:  Blood pressure (!) 145/78, pulse (!) 111, temperature 98.6 F (37 C), temperature source Oral, resp. rate 18, height '5\' 5"'$  (1.651 m), weight 202 lb 14.4 oz (92 kg), last menstrual period 11/09/2016, SpO2 100 %.  Physical Exam: WD in NAD Neck  Supple NROM, without any enlargements.  Lymph Node Survey No cervical supraclavicular or inguinal adenopathy Cardiovascular  Pulse normal rate, regularity and rhythm. S1 and S2 normal.  Lungs  Clear to auscultation bilateraly, without wheezes/crackles/rhonchi. Good air movement.  Skin  No rash/lesions/breakdown  Psychiatry  Alert and oriented to person, place, and time  Abdomen  Normoactive bowel sounds, abdomen soft, non-tender and overweight without evidence of hernia.  Back No CVA tenderness Genito Urinary  Vulva/vagina: Normal external female genitalia.   No lesions. No discharge or bleeding.  Bladder/urethra:  No lesions or masses, well supported bladder  Vagina: normal  Cervix: Normal appearing, no lesions.  Uterus:  Bulky, mobile, no parametrial involvement or nodularity.  Adnexa: Large 10+cm smooth, mobile hard mass in mid right abdomen appreciated. Rectal  deferred Extremities  No bilateral cyanosis, clubbing or edema.   Donaciano Eva, MD  12/10/2016, 10:16 AM

## 2016-12-11 LAB — CA 125: Cancer Antigen (CA) 125: 477.2 U/mL — ABNORMAL HIGH (ref 0.0–38.1)

## 2016-12-11 NOTE — Progress Notes (Signed)
Scheduling pre op- please PLACE SURGICAL ORDERS IN EPIC  Thanks 

## 2016-12-12 ENCOUNTER — Other Ambulatory Visit (HOSPITAL_COMMUNITY): Payer: Self-pay | Admitting: Pharmacy Technician

## 2016-12-12 ENCOUNTER — Telehealth: Payer: Self-pay | Admitting: Nurse Practitioner

## 2016-12-12 ENCOUNTER — Other Ambulatory Visit (HOSPITAL_COMMUNITY): Payer: Self-pay | Admitting: *Deleted

## 2016-12-12 NOTE — Telephone Encounter (Signed)
Patient left message on clinic phone to see if she can drop off FMLA papers to be filled out. Rn called back and left message on personal phone that it is okay to drop off papers and we will try to finish them quickly but by early next week at the latest. Surgery 12/20/16.

## 2016-12-13 ENCOUNTER — Encounter (HOSPITAL_COMMUNITY)
Admission: RE | Admit: 2016-12-13 | Discharge: 2016-12-13 | Disposition: A | Discharge status: Home / Self Care | Payer: 59 | Source: Ambulatory Visit | Attending: Gynecologic Oncology | Admitting: Gynecologic Oncology

## 2016-12-13 ENCOUNTER — Encounter (HOSPITAL_COMMUNITY): Payer: Self-pay | Admitting: *Deleted

## 2016-12-13 DIAGNOSIS — Z0183 Encounter for blood typing: Secondary | ICD-10-CM | POA: Diagnosis not present

## 2016-12-13 DIAGNOSIS — N859 Noninflammatory disorder of uterus, unspecified: Secondary | ICD-10-CM | POA: Insufficient documentation

## 2016-12-13 DIAGNOSIS — Z01812 Encounter for preprocedural laboratory examination: Secondary | ICD-10-CM | POA: Diagnosis present

## 2016-12-13 HISTORY — DX: Headache: R51

## 2016-12-13 HISTORY — DX: Headache, unspecified: R51.9

## 2016-12-13 LAB — COMPREHENSIVE METABOLIC PANEL
ALT: 20 U/L (ref 14–54)
AST: 24 U/L (ref 15–41)
Albumin: 4.6 g/dL (ref 3.5–5.0)
Alkaline Phosphatase: 70 U/L (ref 38–126)
Anion gap: 8 (ref 5–15)
BILIRUBIN TOTAL: 1 mg/dL (ref 0.3–1.2)
BUN: 23 mg/dL — AB (ref 6–20)
CO2: 24 mmol/L (ref 22–32)
Calcium: 9 mg/dL (ref 8.9–10.3)
Chloride: 104 mmol/L (ref 101–111)
Creatinine, Ser: 0.89 mg/dL (ref 0.44–1.00)
Glucose, Bld: 89 mg/dL (ref 65–99)
POTASSIUM: 4.4 mmol/L (ref 3.5–5.1)
Sodium: 136 mmol/L (ref 135–145)
TOTAL PROTEIN: 7.7 g/dL (ref 6.5–8.1)

## 2016-12-13 LAB — URINALYSIS, ROUTINE W REFLEX MICROSCOPIC
BACTERIA UA: NONE SEEN
BILIRUBIN URINE: NEGATIVE
Bilirubin Urine: NEGATIVE
GLUCOSE, UA: NEGATIVE mg/dL
GLUCOSE, UA: NEGATIVE mg/dL
KETONES UR: NEGATIVE mg/dL
KETONES UR: NEGATIVE mg/dL
LEUKOCYTES UA: NEGATIVE
LEUKOCYTES UA: NEGATIVE
NITRITE: NEGATIVE
Nitrite: NEGATIVE
PH: 5 (ref 5.0–8.0)
PH: 5 (ref 5.0–8.0)
Protein, ur: 30 mg/dL — AB
Protein, ur: 30 mg/dL — AB
Specific Gravity, Urine: 1.026 (ref 1.005–1.030)
Specific Gravity, Urine: 1.026 (ref 1.005–1.030)
WBC UA: NONE SEEN WBC/hpf (ref 0–5)

## 2016-12-13 LAB — CBC WITH DIFFERENTIAL/PLATELET
BASOS ABS: 0 10*3/uL (ref 0.0–0.1)
Basophils Relative: 1 %
EOS PCT: 4 %
Eosinophils Absolute: 0.3 10*3/uL (ref 0.0–0.7)
HEMATOCRIT: 43.4 % (ref 36.0–46.0)
Hemoglobin: 14.2 g/dL (ref 12.0–15.0)
LYMPHS ABS: 1.5 10*3/uL (ref 0.7–4.0)
LYMPHS PCT: 19 %
MCH: 26.5 pg (ref 26.0–34.0)
MCHC: 32.7 g/dL (ref 30.0–36.0)
MCV: 81 fL (ref 78.0–100.0)
MONO ABS: 0.6 10*3/uL (ref 0.1–1.0)
MONOS PCT: 8 %
NEUTROS ABS: 5.4 10*3/uL (ref 1.7–7.7)
Neutrophils Relative %: 70 %
Platelets: 343 10*3/uL (ref 150–400)
RBC: 5.36 MIL/uL — ABNORMAL HIGH (ref 3.87–5.11)
RDW: 13.8 % (ref 11.5–15.5)
WBC: 7.8 10*3/uL (ref 4.0–10.5)

## 2016-12-13 LAB — PREGNANCY, URINE: PREG TEST UR: NEGATIVE

## 2016-12-13 LAB — ABO/RH: ABO/RH(D): B POS

## 2016-12-13 NOTE — Patient Instructions (Signed)
Diana Long  12/13/2016   Your procedure is scheduled on: 12/20/2016    Report to [facility] Main  Entrance take [place]  elevators to 3rd floor to  [place] at  0800 AM.  Call this number if you have problems the morning of surgery [phone]   Remember: ONLY 1 PERSON MAY GO WITH YOU TO SHORT STAY TO GET  READY MORNING OF [place].  Do not eat food or drink liquids :After Midnight.             Eat a light diet the day before surgery.  Examples include: soups, broths, toast, yogurt and mashed potatoes. Things to avoid include raw fruits and vegetabels and beans and carbonated beverages.       Take these medicines the morning of surgery with A SIP OF WATER: none                                 You may not have any metal on your body including hair pins and              piercings  Do not wear jewelry, make-up, lotions, powders or perfumes, deodorant             Do not wear nail polish.  Do not shave  48 hours prior to surgery.                 Do not bring valuables to the hospital. [place].  Contacts, dentures or bridgework may not be worn into surgery.  Leave suitcase in the car. After surgery it may be brought to your room.       Special Instructions: coughing and deep breathing exercises, leg exercises               Please read over the following fact sheets you were given: _____________________________________________________________________             [facility] - Preparing for Surgery Before surgery, you can play an important role.  Because skin is not sterile, your skin needs to be as free of germs as possible.  You can reduce the number of germs on your skin by washing with CHG (chlorahexidine gluconate) soap before surgery.  CHG is an antiseptic cleaner which kills germs and bonds with the skin to continue killing germs even after washing. Please DO NOT use if you have an  allergy to CHG or antibacterial soaps.  If your skin becomes reddened/irritated stop using the CHG and inform your nurse when you arrive at Short Stay. Do not shave (including legs and underarms) for at least 48 hours prior to the first CHG shower.  You may shave your face/neck. Please follow these instructions carefully:  1.  Shower with CHG Soap the night before surgery and the  morning of Surgery.  2.  If you choose to wash your hair, wash your hair first as usual with your  normal  shampoo.  3.  After you shampoo, rinse your hair and body thoroughly to remove the  shampoo.                           4.  Use  CHG as you would any other liquid soap.  You can apply chg directly  to the skin and wash                       Gently with a scrungie or clean washcloth.  5.  Apply the CHG Soap to your body ONLY FROM THE NECK DOWN.   Do not use on face/ open                           Wound or open sores. Avoid contact with eyes, ears mouth and genitals (private parts).                       Wash face,  Genitals (private parts) with your normal soap.             6.  Wash thoroughly, paying special attention to the area where your surgery  will be performed.  7.  Thoroughly rinse your body with warm water from the neck down.  8.  DO NOT shower/wash with your normal soap after using and rinsing off  the CHG Soap.                9.  Pat yourself dry with a clean towel.            10.  Wear clean pajamas.            11.  Place clean sheets on your bed the night of your first shower and do not  sleep with pets. Day of Surgery : Do not apply any lotions/deodorants the morning of surgery.  Please wear clean clothes to the hospital/surgery center.  FAILURE TO FOLLOW THESE INSTRUCTIONS MAY RESULT IN THE CANCELLATION OF YOUR SURGERY PATIENT SIGNATURE_________________________________  NURSE  SIGNATURE__________________________________  ________________________________________________________________________  WHAT IS A BLOOD TRANSFUSION? Blood Transfusion Information  A transfusion is the replacement of blood or some of its parts. Blood is made up of multiple cells which provide different functions.  Red blood cells carry oxygen and are used for blood loss replacement.  White blood cells fight against infection.  Platelets control bleeding.  Plasma helps clot blood.  Other blood products are available for specialized needs, such as hemophilia or other clotting disorders. BEFORE THE TRANSFUSION  Who gives blood for transfusions?   Healthy volunteers who are fully evaluated to make sure their blood is safe. This is blood bank blood. Transfusion therapy is the safest it has ever been in the practice of medicine. Before blood is taken from a donor, a complete history is taken to make sure that person has no history of diseases nor engages in risky social behavior (examples are intravenous drug use or sexual activity with multiple partners). The donor's travel history is screened to minimize risk of transmitting infections, such as malaria. The donated blood is tested for signs of infectious diseases, such as HIV and hepatitis. The blood is then tested to be sure it is compatible with you in order to minimize the chance of a transfusion reaction. If you or a relative donates blood, this is often done in anticipation of surgery and is not appropriate for emergency situations. It takes many days to process the donated blood. RISKS AND COMPLICATIONS Although transfusion therapy is very safe and saves many lives, the main dangers of transfusion include:   Getting an infectious disease.  Developing a transfusion reaction. This is an allergic  reaction to something in the blood you were given. Every precaution is taken to prevent this. The decision to have a blood transfusion has been  considered carefully by your caregiver before blood is given. Blood is not given unless the benefits outweigh the risks. AFTER THE TRANSFUSION  Right after receiving a blood transfusion, you will usually feel much better and more energetic. This is especially true if your red blood cells have gotten low (anemic). The transfusion raises the level of the red blood cells which carry oxygen, and this usually causes an energy increase.  The nurse administering the transfusion will monitor you carefully for complications. HOME CARE INSTRUCTIONS  No special instructions are needed after a transfusion. You may find your energy is better. Speak with your caregiver about any limitations on activity for underlying diseases you may have. SEEK MEDICAL CARE IF:   Your condition is not improving after your transfusion.  You develop redness or irritation at the intravenous (IV) site. SEEK IMMEDIATE MEDICAL CARE IF:  Any of the following symptoms occur over the next 12 hours:  Shaking chills.  You have a temperature by mouth above 102 F (38.9 C), not controlled by medicine.  Chest, back, or muscle pain.  People around you feel you are not acting correctly or are confused.  Shortness of breath or difficulty breathing.  Dizziness and fainting.  You get a rash or develop hives.  You have a decrease in urine output.  Your urine turns a dark color or changes to pink, red, or brown. Any of the following symptoms occur over the next 10 days:  You have a temperature by mouth above 102 F (38.9 C), not controlled by medicine.  Shortness of breath.  Weakness after normal activity.  The white part of the eye turns yellow (jaundice).  You have a decrease in the amount of urine or are urinating less often.  Your urine turns a dark color or changes to pink, red, or brown. Document Released: 12/07/2000 Document Revised: 03/03/2012 Document Reviewed: 07/26/2008 ExitCare Patient Information 2014  [place].  _______________________________________________________________________  Incentive Spirometer  An incentive spirometer is a tool that can help keep your lungs clear and active. This tool measures how well you are filling your lungs with each breath. Taking long deep breaths may help reverse or decrease the chance of developing breathing (pulmonary) problems (especially infection) following:  A long period of time when you are unable to move or be active. BEFORE THE PROCEDURE   If the spirometer includes an indicator to show your best effort, your nurse or respiratory therapist will set it to a desired goal.  If possible, sit up straight or lean slightly forward. Try not to slouch.  Hold the incentive spirometer in an upright position. INSTRUCTIONS FOR USE  1. Sit on the edge of your bed if possible, or sit up as far as you can in bed or on a chair. 2. Hold the incentive spirometer in an upright position. 3. Breathe out normally. 4. Place the mouthpiece in your mouth and seal your lips tightly around it. 5. Breathe in slowly and as deeply as possible, raising the piston or the ball toward the top of the column. 6. Hold your breath for 3-5 seconds or for as long as possible. Allow the piston or ball to fall to the bottom of the column. 7. Remove the mouthpiece from your mouth and breathe out normally. 8. Rest for a few seconds and repeat Steps 1 through 7 at  least 10 times every 1-2 hours when you are awake. Take your time and take a few normal breaths between deep breaths. 9. The spirometer may include an indicator to show your best effort. Use the indicator as a goal to work toward during each repetition. 10. After each set of 10 deep breaths, practice coughing to be sure your lungs are clear. If you have an incision (the cut made at the time of surgery), support your incision when coughing by placing a pillow or rolled up towels firmly against it. Once you are able to get  out of bed, walk around indoors and cough well. You may stop using the incentive spirometer when instructed by your caregiver.  RISKS AND COMPLICATIONS  Take your time so you do not get dizzy or light-headed.  If you are in pain, you may need to take or ask for pain medication before doing incentive spirometry. It is harder to take a deep breath if you are having pain. AFTER USE  Rest and breathe slowly and easily.  It can be helpful to keep track of a log of your progress. Your caregiver can provide you with a simple table to help with this. If you are using the spirometer at home, follow these instructions: [place] IF:   You are having difficultly using the spirometer.  You have trouble using the spirometer as often as instructed.  Your pain medication is not giving enough relief while using the spirometer.  You develop fever of 100.5 F (38.1 C) or higher. SEEK IMMEDIATE MEDICAL CARE IF:   You cough up bloody sputum that had not been present before.  You develop fever of 102 F (38.9 C) or greater.  You develop worsening pain at or near the incision site. MAKE SURE YOU:   Understand these instructions.  Will watch your condition.  Will get help right away if you are not doing well or get worse. Document Released: 04/22/2007 Document Revised: 03/03/2012 Document Reviewed: 06/23/2007 [facility] Patient Information 2014 [place], [place].   ________________________________________________________________________

## 2016-12-13 NOTE — Progress Notes (Signed)
U/A done 12/13/16 faxed via EPIC to Dr Denman George and Zoila Shutter

## 2016-12-13 NOTE — Progress Notes (Signed)
CMP done 12/13/16 sent via epic to Dr. Denman George and Joylene John.

## 2016-12-14 LAB — INHIBIN B: Inhibin B: 36.1 pg/mL

## 2016-12-20 ENCOUNTER — Encounter (HOSPITAL_COMMUNITY)
Admission: AD | Disposition: A | Discharge status: Home / Self Care | Payer: Self-pay | Source: Ambulatory Visit | Attending: Gynecologic Oncology

## 2016-12-20 ENCOUNTER — Ambulatory Visit (HOSPITAL_COMMUNITY): Payer: 59 | Admitting: Certified Registered Nurse Anesthetist

## 2016-12-20 ENCOUNTER — Encounter (HOSPITAL_COMMUNITY): Payer: Self-pay | Admitting: *Deleted

## 2016-12-20 ENCOUNTER — Inpatient Hospital Stay (HOSPITAL_COMMUNITY)
Admission: AD | Admit: 2016-12-20 | Discharge: 2016-12-22 | DRG: 737 | Disposition: A | Discharge status: Home / Self Care | Payer: 59 | Source: Ambulatory Visit | Attending: Gynecologic Oncology | Admitting: Gynecologic Oncology

## 2016-12-20 DIAGNOSIS — Z803 Family history of malignant neoplasm of breast: Secondary | ICD-10-CM | POA: Diagnosis not present

## 2016-12-20 DIAGNOSIS — N135 Crossing vessel and stricture of ureter without hydronephrosis: Secondary | ICD-10-CM | POA: Diagnosis present

## 2016-12-20 DIAGNOSIS — N839 Noninflammatory disorder of ovary, fallopian tube and broad ligament, unspecified: Secondary | ICD-10-CM | POA: Diagnosis present

## 2016-12-20 DIAGNOSIS — N838 Other noninflammatory disorders of ovary, fallopian tube and broad ligament: Secondary | ICD-10-CM | POA: Diagnosis not present

## 2016-12-20 DIAGNOSIS — N801 Endometriosis of ovary: Secondary | ICD-10-CM | POA: Diagnosis present

## 2016-12-20 DIAGNOSIS — N808 Other endometriosis: Secondary | ICD-10-CM | POA: Diagnosis present

## 2016-12-20 DIAGNOSIS — E663 Overweight: Secondary | ICD-10-CM | POA: Diagnosis present

## 2016-12-20 DIAGNOSIS — Z6832 Body mass index (BMI) 32.0-32.9, adult: Secondary | ICD-10-CM

## 2016-12-20 DIAGNOSIS — R971 Elevated cancer antigen 125 [CA 125]: Secondary | ICD-10-CM

## 2016-12-20 DIAGNOSIS — N83512 Torsion of left ovary and ovarian pedicle: Secondary | ICD-10-CM | POA: Diagnosis present

## 2016-12-20 DIAGNOSIS — N736 Female pelvic peritoneal adhesions (postinfective): Secondary | ICD-10-CM | POA: Diagnosis present

## 2016-12-20 DIAGNOSIS — D3912 Neoplasm of uncertain behavior of left ovary: Secondary | ICD-10-CM | POA: Diagnosis present

## 2016-12-20 DIAGNOSIS — R1900 Intra-abdominal and pelvic swelling, mass and lump, unspecified site: Secondary | ICD-10-CM | POA: Diagnosis present

## 2016-12-20 HISTORY — PX: ABDOMINAL HYSTERECTOMY: SHX81

## 2016-12-20 HISTORY — PX: LAPAROTOMY: SHX154

## 2016-12-20 HISTORY — PX: OMENTECTOMY: SHX5985

## 2016-12-20 LAB — TYPE AND SCREEN
ABO/RH(D): B POS
Antibody Screen: NEGATIVE

## 2016-12-20 LAB — CREATININE, SERUM
CREATININE: 0.73 mg/dL (ref 0.44–1.00)
GFR calc non Af Amer: >60 mL/min (ref >60)

## 2016-12-20 SURGERY — LAPAROTOMY, EXPLORATORY
Anesthesia: General | Laterality: Right

## 2016-12-20 MED ORDER — FENTANYL CITRATE (PF) 100 MCG/2ML IJ SOLN
INTRAMUSCULAR | Status: AC
  Administered 2016-12-20: 25 ug via INTRAVENOUS
  Filled 2016-12-20: qty 2

## 2016-12-20 MED ORDER — KETOROLAC TROMETHAMINE 30 MG/ML IJ SOLN: 15.0000 mg | Freq: Four times a day (QID) | INTRAMUSCULAR | Status: AC

## 2016-12-20 MED ORDER — SUGAMMADEX SODIUM 200 MG/2ML IV SOLN
INTRAVENOUS | Status: DC | PRN
  Administered 2016-12-20: 200 mg via INTRAVENOUS

## 2016-12-20 MED ORDER — ONDANSETRON HCL 4 MG/2ML IJ SOLN: 4.0000 mg | Freq: Once | INTRAMUSCULAR | Status: DC | PRN

## 2016-12-20 MED ORDER — KCL IN DEXTROSE-NACL 20-5-0.45 MEQ/L-%-% IV SOLN
INTRAVENOUS | Status: AC
  Administered 2016-12-20: 1000 mL via INTRAVENOUS
  Filled 2016-12-20: qty 1000

## 2016-12-20 MED ORDER — ROCURONIUM BROMIDE 50 MG/5ML IV SOSY
PREFILLED_SYRINGE | INTRAVENOUS | Status: AC
  Filled 2016-12-20: qty 5

## 2016-12-20 MED ORDER — SODIUM CHLORIDE 0.9 % IJ SOLN
INTRAMUSCULAR | Status: AC
Start: 2016-12-20 — End: 2016-12-20
  Filled 2016-12-20: qty 50

## 2016-12-20 MED ORDER — FENTANYL CITRATE (PF) 100 MCG/2ML IJ SOLN
INTRAMUSCULAR | Status: AC
  Filled 2016-12-20: qty 2

## 2016-12-20 MED ORDER — BUPIVACAINE HCL (PF) 0.25 % IJ SOLN
INTRAMUSCULAR | Status: AC
Start: 2016-12-20 — End: 2016-12-20
  Filled 2016-12-20: qty 30

## 2016-12-20 MED ORDER — DEXAMETHASONE SODIUM PHOSPHATE 10 MG/ML IJ SOLN
INTRAMUSCULAR | Status: DC | PRN
  Administered 2016-12-20: 10 mg via INTRAVENOUS

## 2016-12-20 MED ORDER — MIDAZOLAM HCL 5 MG/5ML IJ SOLN
INTRAMUSCULAR | Status: DC | PRN
  Administered 2016-12-20: 2 mg via INTRAVENOUS

## 2016-12-20 MED ORDER — HYDROMORPHONE HCL 1 MG/ML IJ SOLN
0.5000 mg | INTRAMUSCULAR | Status: AC | PRN
  Administered 2016-12-20 (×2): 0.5 mg via INTRAVENOUS
  Filled 2016-12-20 (×2): qty 0.5

## 2016-12-20 MED ORDER — OXYCODONE HCL 5 MG/5ML PO SOLN: 5.0000 mg | Freq: Once | ORAL | Status: DC | PRN

## 2016-12-20 MED ORDER — SENNOSIDES-DOCUSATE SODIUM 8.6-50 MG PO TABS
2.0000 | ORAL_TABLET | Freq: Every day | ORAL | Status: DC
  Administered 2016-12-20 – 2016-12-22 (×2): 2 via ORAL
  Filled 2016-12-20 (×2): qty 2

## 2016-12-20 MED ORDER — ONDANSETRON HCL 4 MG/2ML IJ SOLN
4.0000 mg | Freq: Four times a day (QID) | INTRAMUSCULAR | Status: DC | PRN
  Administered 2016-12-20 (×2): 4 mg via INTRAVENOUS
  Filled 2016-12-20 (×2): qty 2

## 2016-12-20 MED ORDER — ENOXAPARIN SODIUM 40 MG/0.4ML ~~LOC~~ SOLN
40.0000 mg | SUBCUTANEOUS | Status: AC
  Administered 2016-12-20: 40 mg via SUBCUTANEOUS
  Filled 2016-12-20: qty 0.4

## 2016-12-20 MED ORDER — HYDROMORPHONE HCL 1 MG/ML IJ SOLN
INTRAMUSCULAR | Status: DC | PRN
  Administered 2016-12-20 (×5): .4 mg via INTRAVENOUS

## 2016-12-20 MED ORDER — PROPOFOL 10 MG/ML IV BOLUS
INTRAVENOUS | Status: DC | PRN
  Administered 2016-12-20: 180 mg via INTRAVENOUS

## 2016-12-20 MED ORDER — OXYCODONE HCL 5 MG PO TABS: 5.0000 mg | ORAL_TABLET | Freq: Once | ORAL | Status: DC | PRN

## 2016-12-20 MED ORDER — ESTRADIOL 0.1 MG/24HR TD PTWK
0.1000 mg | MEDICATED_PATCH | TRANSDERMAL | Status: DC
  Administered 2016-12-21: 08:00:00 0.1 mg via TRANSDERMAL
  Filled 2016-12-20: qty 1

## 2016-12-20 MED ORDER — BUPIVACAINE HCL 0.25 % IJ SOLN
INTRAMUSCULAR | Status: DC | PRN
  Administered 2016-12-20: 20 mL

## 2016-12-20 MED ORDER — HYDROMORPHONE HCL 2 MG/ML IJ SOLN
INTRAMUSCULAR | Status: AC
  Filled 2016-12-20: qty 1

## 2016-12-20 MED ORDER — PROPOFOL 10 MG/ML IV BOLUS
INTRAVENOUS | Status: AC
  Filled 2016-12-20: qty 20

## 2016-12-20 MED ORDER — GABAPENTIN 300 MG PO CAPS
600.0000 mg | ORAL_CAPSULE | Freq: Every day | ORAL | Status: AC
  Administered 2016-12-20 – 2016-12-22 (×2): 600 mg via ORAL
  Filled 2016-12-20 (×2): qty 2

## 2016-12-20 MED ORDER — ENOXAPARIN SODIUM 40 MG/0.4ML ~~LOC~~ SOLN
40.0000 mg | SUBCUTANEOUS | Status: DC
  Administered 2016-12-21 – 2016-12-22 (×2): 40 mg via SUBCUTANEOUS
  Filled 2016-12-20 (×2): qty 0.4

## 2016-12-20 MED ORDER — KETOROLAC TROMETHAMINE 30 MG/ML IJ SOLN
15.0000 mg | Freq: Four times a day (QID) | INTRAMUSCULAR | Status: AC
  Administered 2016-12-20 – 2016-12-21 (×3): 15 mg via INTRAVENOUS
  Filled 2016-12-20 (×3): qty 1

## 2016-12-20 MED ORDER — LIDOCAINE 2% (20 MG/ML) 5 ML SYRINGE
INTRAMUSCULAR | Status: DC | PRN
  Administered 2016-12-20: 100 mg via INTRAVENOUS

## 2016-12-20 MED ORDER — DEXAMETHASONE SODIUM PHOSPHATE 10 MG/ML IJ SOLN
INTRAMUSCULAR | Status: AC
  Filled 2016-12-20: qty 1

## 2016-12-20 MED ORDER — IBUPROFEN 400 MG PO TABS
800.0000 mg | ORAL_TABLET | Freq: Four times a day (QID) | ORAL | Status: DC
  Administered 2016-12-21 – 2016-12-22 (×4): 800 mg via ORAL
  Filled 2016-12-20 (×4): qty 2

## 2016-12-20 MED ORDER — CEFAZOLIN SODIUM-DEXTROSE 2-4 GM/100ML-% IV SOLN
2.0000 g | INTRAVENOUS | Status: AC
  Administered 2016-12-20: 2 g via INTRAVENOUS
  Filled 2016-12-20: qty 100

## 2016-12-20 MED ORDER — FENTANYL CITRATE (PF) 100 MCG/2ML IJ SOLN
INTRAMUSCULAR | Status: DC | PRN
  Administered 2016-12-20 (×6): 50 ug via INTRAVENOUS

## 2016-12-20 MED ORDER — LIDOCAINE 2% (20 MG/ML) 5 ML SYRINGE
INTRAMUSCULAR | Status: AC
  Filled 2016-12-20: qty 5

## 2016-12-20 MED ORDER — SODIUM CHLORIDE 0.9 % IJ SOLN
INTRAMUSCULAR | Status: DC | PRN
  Administered 2016-12-20: 20 mL

## 2016-12-20 MED ORDER — KCL IN DEXTROSE-NACL 20-5-0.45 MEQ/L-%-% IV SOLN
INTRAVENOUS | Status: DC
  Administered 2016-12-20: 1000 mL via INTRAVENOUS
  Filled 2016-12-20: qty 1000

## 2016-12-20 MED ORDER — CEFAZOLIN SODIUM-DEXTROSE 2-4 GM/100ML-% IV SOLN
INTRAVENOUS | Status: AC
Start: 2016-12-20 — End: 2016-12-20
  Filled 2016-12-20: qty 100

## 2016-12-20 MED ORDER — LIP MEDEX EX OINT
TOPICAL_OINTMENT | CUTANEOUS | Status: AC
  Administered 2016-12-20: 16:00:00
  Filled 2016-12-20: qty 7

## 2016-12-20 MED ORDER — ONDANSETRON HCL 4 MG/2ML IJ SOLN
INTRAMUSCULAR | Status: DC | PRN
  Administered 2016-12-20: 4 mg via INTRAVENOUS

## 2016-12-20 MED ORDER — ONDANSETRON HCL 4 MG PO TABS: 4.0000 mg | ORAL_TABLET | Freq: Four times a day (QID) | ORAL | Status: DC | PRN

## 2016-12-20 MED ORDER — FENTANYL CITRATE (PF) 100 MCG/2ML IJ SOLN
INTRAMUSCULAR | Status: AC
  Filled 2016-12-20: qty 4

## 2016-12-20 MED ORDER — ACETAMINOPHEN 500 MG PO TABS
1000.0000 mg | ORAL_TABLET | Freq: Four times a day (QID) | ORAL | Status: DC
  Administered 2016-12-20 – 2016-12-22 (×6): 1000 mg via ORAL
  Filled 2016-12-20 (×7): qty 2

## 2016-12-20 MED ORDER — ROCURONIUM BROMIDE 50 MG/5ML IV SOSY
PREFILLED_SYRINGE | INTRAVENOUS | Status: DC | PRN
  Administered 2016-12-20: 20 mg via INTRAVENOUS
  Administered 2016-12-20: 10 mg via INTRAVENOUS
  Administered 2016-12-20: 50 mg via INTRAVENOUS
  Administered 2016-12-20: 10 mg via INTRAVENOUS

## 2016-12-20 MED ORDER — BUPIVACAINE LIPOSOME 1.3 % IJ SUSP
20.0000 mL | Freq: Once | INTRAMUSCULAR | Status: AC
  Administered 2016-12-20: 20 mL
  Filled 2016-12-20: qty 20

## 2016-12-20 MED ORDER — ACETAMINOPHEN 10 MG/ML IV SOLN
INTRAVENOUS | Status: AC
  Administered 2016-12-20: 1000 mg via INTRAVENOUS
  Filled 2016-12-20: qty 100

## 2016-12-20 MED ORDER — FENTANYL CITRATE (PF) 100 MCG/2ML IJ SOLN
25.0000 ug | INTRAMUSCULAR | Status: DC | PRN
  Administered 2016-12-20: 50 ug via INTRAVENOUS
  Administered 2016-12-20 (×2): 25 ug via INTRAVENOUS
  Administered 2016-12-20: 50 ug via INTRAVENOUS

## 2016-12-20 MED ORDER — LACTATED RINGERS IV SOLN
INTRAVENOUS | Status: DC | PRN
  Administered 2016-12-20 (×3): via INTRAVENOUS

## 2016-12-20 MED ORDER — MIDAZOLAM HCL 2 MG/2ML IJ SOLN
INTRAMUSCULAR | Status: AC
  Filled 2016-12-20: qty 2

## 2016-12-20 MED ORDER — PROGESTERONE MICRONIZED 100 MG PO CAPS
100.0000 mg | ORAL_CAPSULE | Freq: Every day | ORAL | Status: DC
  Filled 2016-12-20: qty 1

## 2016-12-20 MED ORDER — OXYCODONE HCL 5 MG PO TABS
5.0000 mg | ORAL_TABLET | ORAL | Status: DC | PRN
  Administered 2016-12-22: 5 mg via ORAL
  Filled 2016-12-20 (×2): qty 1

## 2016-12-20 MED ORDER — SCOPOLAMINE 1 MG/3DAYS TD PT72
MEDICATED_PATCH | TRANSDERMAL | Status: DC | PRN
  Administered 2016-12-20: 1 via TRANSDERMAL

## 2016-12-20 MED ORDER — FENTANYL CITRATE (PF) 100 MCG/2ML IJ SOLN
INTRAMUSCULAR | Status: AC
  Administered 2016-12-20: 50 ug via INTRAVENOUS
  Filled 2016-12-20: qty 2

## 2016-12-20 MED ORDER — ONDANSETRON HCL 4 MG/2ML IJ SOLN
INTRAMUSCULAR | Status: AC
  Filled 2016-12-20: qty 2

## 2016-12-20 MED ORDER — ACETAMINOPHEN 10 MG/ML IV SOLN
1000.0000 mg | Freq: Once | INTRAVENOUS | Status: AC
  Administered 2016-12-20: 1000 mg via INTRAVENOUS

## 2016-12-20 MED ORDER — KETOROLAC TROMETHAMINE 15 MG/ML IJ SOLN
INTRAMUSCULAR | Status: AC
  Administered 2016-12-20: 15 mg
  Filled 2016-12-20: qty 1

## 2016-12-20 MED ORDER — 0.9 % SODIUM CHLORIDE (POUR BTL) OPTIME
TOPICAL | Status: DC | PRN
  Administered 2016-12-20: 2000 mL

## 2016-12-20 MED ORDER — SCOPOLAMINE 1 MG/3DAYS TD PT72
MEDICATED_PATCH | TRANSDERMAL | Status: AC
  Filled 2016-12-20: qty 1

## 2016-12-20 MED ORDER — ENSURE ENLIVE PO LIQD
237.0000 mL | Freq: Two times a day (BID) | ORAL | Status: DC
  Administered 2016-12-21 – 2016-12-22 (×3): 237 mL via ORAL

## 2016-12-20 SURGICAL SUPPLY — 76 items
ATTRACTOMAT 16X20 MAGNETIC DRP (DRAPES) ×4 IMPLANT
BLADE EXTENDED COATED 6.5IN (ELECTRODE) ×4 IMPLANT
CATH ROBINSON RED A/P 16FR (CATHETERS) IMPLANT
CELLS DAT CNTRL 66122 CELL SVR (MISCELLANEOUS) IMPLANT
CHLORAPREP W/TINT 26ML (MISCELLANEOUS) ×4 IMPLANT
CLIP TI LARGE 6 (CLIP) IMPLANT
CLIP TI MEDIUM 6 (CLIP) ×4 IMPLANT
CLIP TI MEDIUM LARGE 6 (CLIP) ×4 IMPLANT
CONT SPEC 4OZ CLIKSEAL STRL BL (MISCELLANEOUS) ×4 IMPLANT
COVER SURGICAL LIGHT HANDLE (MISCELLANEOUS) IMPLANT
DERMABOND ADVANCED (GAUZE/BANDAGES/DRESSINGS)
DERMABOND ADVANCED .7 DNX12 (GAUZE/BANDAGES/DRESSINGS) IMPLANT
DRAPE INCISE IOBAN 66X45 STRL (DRAPES) IMPLANT
DRAPE SHEET LG 3/4 BI-LAMINATE (DRAPES) IMPLANT
DRAPE WARM FLUID 44X44 (DRAPE) ×4 IMPLANT
DRSG OPSITE POSTOP 4X10 (GAUZE/BANDAGES/DRESSINGS) ×4 IMPLANT
DRSG OPSITE POSTOP 4X12 (GAUZE/BANDAGES/DRESSINGS) IMPLANT
DRSG OPSITE POSTOP 4X8 (GAUZE/BANDAGES/DRESSINGS) IMPLANT
DRSG TELFA 3X8 NADH (GAUZE/BANDAGES/DRESSINGS) IMPLANT
ELECT REM PT RETURN 9FT ADLT (ELECTROSURGICAL) ×4
ELECTRODE REM PT RTRN 9FT ADLT (ELECTROSURGICAL) ×3 IMPLANT
GAUZE SPONGE 4X4 12PLY STRL (GAUZE/BANDAGES/DRESSINGS) IMPLANT
GAUZE SPONGE 4X4 16PLY XRAY LF (GAUZE/BANDAGES/DRESSINGS) IMPLANT
GLOVE BIO SURGEON STRL SZ 6 (GLOVE) ×8 IMPLANT
GLOVE BIO SURGEON STRL SZ 6.5 (GLOVE) ×8 IMPLANT
GOWN SPEC L3 MED W/TWL (GOWN DISPOSABLE) ×12 IMPLANT
GOWN STRL REUS W/ TWL LRG LVL3 (GOWN DISPOSABLE) ×6 IMPLANT
GOWN STRL REUS W/TWL LRG LVL3 (GOWN DISPOSABLE) ×10 IMPLANT
HEMOSTAT ARISTA ABSORB 3G PWDR (MISCELLANEOUS) IMPLANT
KIT BASIN OR (CUSTOM PROCEDURE TRAY) ×4 IMPLANT
LIGASURE IMPACT 36 18CM CVD LR (INSTRUMENTS) ×4 IMPLANT
LOOP VESSEL MAXI BLUE (MISCELLANEOUS) ×4 IMPLANT
LUBRICANT JELLY K Y 4OZ (MISCELLANEOUS) IMPLANT
NEEDLE HYPO 22GX1.5 SAFETY (NEEDLE) ×8 IMPLANT
NS IRRIG 1000ML POUR BTL (IV SOLUTION) IMPLANT
PACK GENERAL/GYN (CUSTOM PROCEDURE TRAY) ×4 IMPLANT
PACK LITHOTOMY IV (CUSTOM PROCEDURE TRAY) ×4 IMPLANT
PAD OB MATERNITY 4.3X12.25 (PERSONAL CARE ITEMS) IMPLANT
RELOAD PROXIMATE 75MM BLUE (ENDOMECHANICALS) IMPLANT
RELOAD PROXIMATE TA60MM BLUE (ENDOMECHANICALS) IMPLANT
RETRACTOR WND ALEXIS 25 LRG (MISCELLANEOUS) IMPLANT
RTRCTR WOUND ALEXIS 18CM MED (MISCELLANEOUS)
RTRCTR WOUND ALEXIS 25CM LRG (MISCELLANEOUS)
SEALER TISSUE X1 CVD JAW (INSTRUMENTS) IMPLANT
SHEET LAVH (DRAPES) ×4 IMPLANT
SPOGE SURGIFLO 8M (HEMOSTASIS)
SPONGE LAP 18X18 X RAY DECT (DISPOSABLE) IMPLANT
SPONGE SURGIFLO 8M (HEMOSTASIS) IMPLANT
STAPLER GUN LINEAR PROX 60 (STAPLE) IMPLANT
STAPLER PROXIMATE 75MM BLUE (STAPLE) IMPLANT
STAPLER VISISTAT 35W (STAPLE) IMPLANT
SURGIFLO W/THROMBIN 8M KIT (HEMOSTASIS) ×8 IMPLANT
SUT MNCRL AB 4-0 PS2 18 (SUTURE) ×8 IMPLANT
SUT PDS AB 1 TP1 96 (SUTURE) ×8 IMPLANT
SUT PROLENE 5 0 CC 1 (SUTURE) IMPLANT
SUT SILK 3 0 SH CR/8 (SUTURE) IMPLANT
SUT VIC AB 0 CT1 27 (SUTURE)
SUT VIC AB 0 CT1 27XBRD ANTBC (SUTURE) IMPLANT
SUT VIC AB 0 CT1 36 (SUTURE) ×16 IMPLANT
SUT VIC AB 2-0 CT1 36 (SUTURE) ×12 IMPLANT
SUT VIC AB 2-0 CT2 27 (SUTURE) ×32 IMPLANT
SUT VIC AB 2-0 SH 27 (SUTURE)
SUT VIC AB 2-0 SH 27X BRD (SUTURE) IMPLANT
SUT VIC AB 3-0 CTX 36 (SUTURE) IMPLANT
SUT VIC AB 3-0 SH 18 (SUTURE) IMPLANT
SUT VIC AB 3-0 SH 27 (SUTURE) ×4
SUT VIC AB 3-0 SH 27X BRD (SUTURE) ×12 IMPLANT
SUT VICRYL 2 0 18  UND BR (SUTURE)
SUT VICRYL 2 0 18 UND BR (SUTURE) IMPLANT
SYR 30ML LL (SYRINGE) ×8 IMPLANT
TOWEL OR 17X26 10 PK STRL BLUE (TOWEL DISPOSABLE) ×4 IMPLANT
TOWEL OR NON WOVEN STRL DISP B (DISPOSABLE) ×4 IMPLANT
TRAY FOLEY W/METER SILVER 16FR (SET/KITS/TRAYS/PACK) ×4 IMPLANT
UNDERPAD 30X30 (UNDERPADS AND DIAPERS) ×4 IMPLANT
UNDERPAD 30X30 INCONTINENT (UNDERPADS AND DIAPERS) ×4 IMPLANT
YANKAUER SUCT BULB TIP 10FT TU (MISCELLANEOUS) IMPLANT

## 2016-12-20 NOTE — Discharge Instructions (Signed)
12/20/2016  Return to work: 4-6 weeks if applicable  Activity: 1. Be up and out of the bed during the day.  Take a nap if needed.  You may walk up steps but be careful and use the hand rail.  Stair climbing will tire you more than you think, you may need to stop part way and rest.   2. No lifting or straining for 6 weeks.  3. No driving for 2 week(s).  Do not drive if you are taking narcotic pain medicine.  4. Shower daily.  Use soap and water on your incision and pat dry; don't rub.  No tub baths until cleared by your surgeon.   5. No sexual activity and nothing in the vagina for 6 weeks.  6. You may experience a small amount of clear drainage from your incisions, which is normal.  If the drainage persists or increases, please call the office.  7. You may experience vaginal spotting after surgery or around the 6-8 week mark from surgery when the stitches at the top of the vagina begin to dissolve.  The spotting is normal but if you experience heavy bleeding, call our office.  Diet: 1. Low sodium Heart Healthy Diet is recommended.  2. It is safe to use a laxative, such as Miralax or Colace, if you have difficulty moving your bowels.   Wound Care: 1. Keep clean and dry.  Shower daily.  Reasons to call the Doctor:  Fever - Oral temperature greater than 100.4 degrees Fahrenheit  Foul-smelling vaginal discharge  Difficulty urinating  Nausea and vomiting  Increased pain at the site of the incision that is unrelieved with pain medicine.  Difficulty breathing with or without chest pain  New calf pain especially if only on one side  Sudden, continuing increased vaginal bleeding with or without clots.   Contacts: For questions or concerns you should contact:  Dr. Everitt Amber at [phone]  Joylene John, NP at [phone]  After Hours: call [phone] and have the GYN Oncologist paged/contacted

## 2016-12-20 NOTE — Anesthesia Procedure Notes (Signed)
Procedure Name: Intubation Date/Time: 12/20/2016 10:46 AM Performed by: West Pugh Pre-anesthesia Checklist: Patient identified, Emergency Drugs available, Suction available, Patient being monitored and Timeout performed Patient Re-evaluated:Patient Re-evaluated prior to inductionOxygen Delivery Method: Circle system utilized Preoxygenation: Pre-oxygenation with 100% oxygen Intubation Type: IV induction Ventilation: Mask ventilation without difficulty Laryngoscope Size: Mac and 4 Grade View: Grade I Tube type: Oral Tube size: 7.5 mm Number of attempts: 1 Airway Equipment and Method: Stylet Placement Confirmation: ETT inserted through vocal cords under direct vision,  positive ETCO2,  CO2 detector and breath sounds checked- equal and bilateral Secured at: 21 cm Tube secured with: Tape Dental Injury: Teeth and Oropharynx as per pre-operative assessment

## 2016-12-20 NOTE — H&P (View-Only) (Signed)
Consult Note: Gyn-Onc  Consult was requested by Dr. Ellyn Hack for the evaluation of Diana Long 45 y.o. female  CC:  Chief Complaint  Patient presents with  . uterine mass    Assessment/Plan:  Diana Long  is a 45 y.o.  year old with a large (12cm) solid and cystic right ovarian mass and abnormal uterine bleeding.  She declined biopsy of the endometrium today due to virginal status.  Recommend surgery with ex lap, RSO, possible hysterectomy (vs D&C) and LSO with staging if malignancy is identified. I discussed that if hysterectomy is performed she will no longer be able to carry a pregnancy.  I discussed operative risks including  bleeding, infection, damage to internal organs (such as bladder,ureters, bowels), blood clot, reoperation and rehospitalization.  Surgery is scheduled for 12/20/16.   HPI: Diana Long is a 45 year old G0 virginal woman who is seen in consultation at the request of Dr Ellyn Hack for a large right ovarian mass.  The patient experienced severe sudden onset left abdominal pains in early December 2017. She was seen at [facility] where CT imaging and US of the pelvis confirmed a uterus measuring 10x5x5.3cm and a 2cm right lateral mid body fibroid. THere was a 12cm right ovarian mass which was complex and cystic and contained mural soft tissue thickening with papillary appearance. No free fluid, omental disease, carcinomatosis or lymphadenopathy was seen.   Her pain resolved within 12 hours and she has been painfree since.  She reports some abnormal irregular vaginal bleeding in the past year which has normalized more recently. She has never been pregnant nor has she been sexually active. She is very healthy and has had no prior abdominal surgeries. She has a family history of breast cancer, but has been personally BRCA tested and was negative.   Current Meds:  Outpatient Encounter Prescriptions as of 12/10/2016  Medication Sig  . ferrous  sulfate 325 (65 FE) MG tablet Take 325 mg by mouth daily with breakfast.  . ibuprofen (ADVIL,MOTRIN) 200 MG tablet Take 400 mg by mouth every 6 (six) hours as needed for fever or mild pain.  . vitamin C (ASCORBIC ACID) 500 MG tablet Take 500 mg by mouth.  . [DISCONTINUED] ondansetron (ZOFRAN-ODT) 4 MG disintegrating tablet Take 1 tablet (4 mg total) by mouth every 8 (eight) hours as needed for nausea or vomiting. (Patient not taking: Reported on 12/10/2016)  . [DISCONTINUED] oxyCODONE-acetaminophen (PERCOCET/ROXICET) 5-325 MG tablet Take 1-2 tablets by mouth every 4 (four) hours as needed for severe pain. (Patient not taking: Reported on 12/10/2016)  . [DISCONTINUED] promethazine (PHENERGAN) 25 MG tablet Take 0.5-1 tablets (12.5-25 mg total) by mouth every 6 (six) hours as needed. (Patient not taking: Reported on 12/10/2016)   No facility-administered encounter medications on file as of 12/10/2016.     Allergy: No Known Allergies  Social Hx:   Social History   Social History  . Marital status: Single    Spouse name: N/A  . Number of children: N/A  . Years of education: N/A   Occupational History  . Not on file.   Social History Main Topics  . Smoking status: Never Smoker  . Smokeless tobacco: Never Used  . Alcohol use Not on file     Comment: social  . Drug use: Unknown  . Sexual activity: Yes    Birth control/ protection: None   Other Topics Concern  . Not on file   Social History Narrative  . No narrative  on file    Past Surgical Hx:  Past Surgical History:  Procedure Laterality Date  . NO PAST SURGERIES      Past Medical Hx:  Past Medical History:  Diagnosis Date  . Medical history non-contributory     Past Gynecological History:   Patient's last menstrual period was 11/09/2016.  Family Hx: History reviewed. No pertinent family history.  Review of Systems:  Constitutional  Feels well,    ENT Normal appearing ears and nares bilaterally Skin/Breast  No  rash, sores, jaundice, itching, dryness Cardiovascular  No chest pain, shortness of breath, or edema  Pulmonary  No cough or wheeze.  Gastro Intestinal  No nausea, vomitting, or diarrhoea. No bright red blood per rectum, no abdominal pain, change in bowel movement, or constipation.  Genito Urinary  No frequency, urgency, dysuria, no pain. Musculo Skeletal  No myalgia, arthralgia, joint swelling or pain  Neurologic  No weakness, numbness, change in gait,  Psychology  No depression, anxiety, insomnia.   Vitals:  Blood pressure (!) 145/78, pulse (!) 111, temperature 98.6 F (37 C), temperature source Oral, resp. rate 18, height '5\' 5"'$  (1.651 m), weight 202 lb 14.4 oz (92 kg), last menstrual period 11/09/2016, SpO2 100 %.  Physical Exam: WD in NAD Neck  Supple NROM, without any enlargements.  Lymph Node Survey No cervical supraclavicular or inguinal adenopathy Cardiovascular  Pulse normal rate, regularity and rhythm. S1 and S2 normal.  Lungs  Clear to auscultation bilateraly, without wheezes/crackles/rhonchi. Good air movement.  Skin  No rash/lesions/breakdown  Psychiatry  Alert and oriented to person, place, and time  Abdomen  Normoactive bowel sounds, abdomen soft, non-tender and overweight without evidence of hernia.  Back No CVA tenderness Genito Urinary  Vulva/vagina: Normal external female genitalia.   No lesions. No discharge or bleeding.  Bladder/urethra:  No lesions or masses, well supported bladder  Vagina: normal  Cervix: Normal appearing, no lesions.  Uterus:  Bulky, mobile, no parametrial involvement or nodularity.  Adnexa: Large 10+cm smooth, mobile hard mass in mid right abdomen appreciated. Rectal  deferred Extremities  No bilateral cyanosis, clubbing or edema.   Donaciano Eva, MD  12/10/2016, 10:16 AM

## 2016-12-20 NOTE — Anesthesia Postprocedure Evaluation (Signed)
Anesthesia Post Note  Patient: Diana Long  Procedure(s) Performed: Procedure(s) (LRB): EXPLORATORY LAPAROTOMY (N/A) HYSTERECTOMY ABDOMINAL WITH BILATERAL  SALPINGO OOPHORECTOMY (Left) OMENTECTOMY WITH LEFT URETEROLYSIS (N/A)  Patient location during evaluation: PACU Anesthesia Type: General Level of consciousness: awake, awake and alert and oriented Pain management: pain level controlled Vital Signs Assessment: post-procedure vital signs reviewed and stable Respiratory status: spontaneous breathing, nonlabored ventilation and respiratory function stable Cardiovascular status: blood pressure returned to baseline Anesthetic complications: no       Last Vitals:  Vitals:   12/20/16 1400 12/20/16 1415  BP: (!) 170/91 (!) 143/62  Pulse: 85 80  Resp: 18 (!) 22  Temp:      Last Pain:  Vitals:   12/20/16 1415  TempSrc:   PainSc: 5                  Semaje Kinker COKER

## 2016-12-20 NOTE — Op Note (Signed)
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 Stage IV endometriosis with a right ovarian retroperitoneal endometrioma densely adherent to the sigmoid colon and ovarian fossa. Sigmoid and rectum adherent to posteror uterus and cervix with obliterated posterior cul de sac. 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  Procedure: The patient was identified in the preoperative holding area. Informed consent was signed on the chart. Patient was seen history was reviewed and exam was performed.   The patient was then taken to the operating room and placed in the supine position with SCD hose on. General anesthesia was then induced without difficulty. She was then placed in the dorsolithotomy position. The abdomen was prepped with chlor prep sponges per protocol. Perineum was prepped with Betadine. The vagina was prepped with Betadine a Foley catheter was inserted into the bladder under sterile conditions.  The patient was then draped after the prep was dried. Timeout was performed the patient, procedure, antibiotic, allergy, and length of procedure. A vertical midline infraumbilical incision was and carried down to the underlying fascia using Bovie cautery. The fascia was scored in the fascial incision was extended superiorly and inferiorly using Bovie cautery. The rectus bellies were dissected off the overlying fascia. The peritoneum was  tented and entered. The peritoneal incision was extended superiorly and inferiorly with visualization of the underlying peritoneal cavity. The Buchwalter self-retaining retractor was then placed. At the initial placement as well as at several points during the case the lateral blades were checked to ensure no significant pressure on the psoas bellies.  Washings were obtained. The left ovary was delivered through the incision and cross clamped at its base. It was transected and sent for pathology which revealed serous borderline tumor.  The small and large bowel were packed out of the way of the surgical field with moist laparotomy sponges and malleable retractors were attached to the [place].   For approximately 30 minutes sharp dissection was performed with metzenbaum scissors to free the rectum from the posterior uterus and cervix and develop the rectovaginal septum. No breach of the rectal wall took place during the dissection. With careful inspection of the rectum an area of deserosalized bowel wall was identified and reinforcing imbricating sutures were placed at this level to reinforce the rectal wall.   The left retroperitoneum was opened and the pararectal space developed. The left ureter was densely adherent to the sigmoid adhesions to the uterus. It was freed from the peritoneum and skeletonized and a vessel loop was placed around the ureter. It was carefully sharply dissected free from its attachments to the central pelvic viscera to the level of the uterine artery tunnel.   The round ligament on the patient's right side was transected with monopolar cautery the anterior posterior leaves the broad ligament were opened. A window was made between the infundibulopelvic vessels and the ureter. The vessels were clamped x2 transected and suture ligated. The bladder flap was created at this point.  The right ovary was enlarged to 6cm with a retroperitoneal cystic  mass and was densely adherent to  the uterus, sigmoid colon and right pelvic sidewall. For 105minutes sharp dissection was employed to free the ovary from its attachments to the visceral structures while carefully identifying and following the course of the right ureter. The uterine vessels on the right side were skeletonized. The uterine vessels were clamped with curved Masterson clamps transected and suture ligated. Our attention was turned to the left side were similar procedure was performed in that the round ligament was transected and the left sided bladder flap was completed. The uterine vessels were skeletonized clamped x2 transected and suture ligated. We continued down the cardinal ligaments with straight Masterson clamps the cervicovaginal junction was reached. We came across the vagina just under the cervix with curved Masterson clamps. The cervix was amputated from the vagina. The angle pedicles as well as vaginal cuff were closed using figure-of-eight sutures of 0 Vicryl.  The pedicles were noted to be hemostatic which was reinforced with surgiflow.  The omentectomy was performed by delivering the infracolic omentum into the abdominal incision. The peritneal attachments to the transverse colon were taken down with the bovie. Windows were created in the omentum and the ligasure was used to bipolar seal the vascular pedicles to free the infracolic omentum from the colon.   The abdomen pelvis were copiously irrigated. The retractor and laparotomy sponges were removed. The fascia was closed using running mass closure of #1 PDS. The subcutaneous tissues were irrigated and made hemostatic. 20 mL of Exparel within 20 mL of normal saline and 20cc of quarter percent marcaine was injected for postoperative pain control. The skin was closed using subcutaneous reapproximation and subcuticular suture with dermabond.  All instrument, suture, laparotomy, Ray-Tec, and needle counts were correct x2. The patient tolerated the procedure well and  was taken recovery room in stable condition. This is Everitt Amber dictating an operative note on [employer]  Donaciano Eva, MD

## 2016-12-20 NOTE — Interval H&P Note (Signed)
History and Physical Interval Note:  12/20/2016 10:27 AM  Diana Long  has presented today for surgery, with the diagnosis of UTERINE MASS  The various methods of treatment have been discussed with the patient and family. After consideration of risks, benefits and other options for treatment, the patient has consented to  Procedure(s): EXPLORATORY LAPAROTOMY (N/A) RIGHT SALPINGO OOPHORECTOMY (Right) HYSTERECTOMY ABDOMINAL WITH possible LEFT SALPINGO OOPHORECTOMY (Left) POSSIBLE  STAGING (N/A)  as a surgical intervention .  The patient's history has been reviewed, patient examined, no change in status, stable for surgery.  I have reviewed the patient's chart and labs.  Her CA 125 was very elevated (>400). I discussed that this raised my suspicion for underlying cancer. She has elected to proceed with hysterectomy rather than D&C/frozen section and hysterectomy if malignancy is identified. Questions were answered to the patient's satisfaction.     Donaciano Eva

## 2016-12-20 NOTE — Transfer of Care (Signed)
Immediate Anesthesia Transfer of Care Note  Patient: Diana Long  Procedure(s) Performed: Procedure(s): EXPLORATORY LAPAROTOMY (N/A) HYSTERECTOMY ABDOMINAL WITH BILATERAL  SALPINGO OOPHORECTOMY (Left) OMENTECTOMY WITH LEFT URETEROLYSIS (N/A)  Patient Location: PACU  Anesthesia Type:General  Level of Consciousness:  sedated, patient cooperative and responds to stimulation  Airway & Oxygen Therapy:Patient Spontanous Breathing and Patient connected to face mask oxgen  Post-op Assessment:  Report given to PACU RN and Post -op Vital signs reviewed and stable  Post vital signs:  Reviewed and stable  Last Vitals:  Vitals:   12/20/16 0758 12/20/16 1317  BP: 130/88   Pulse: 86 86  Resp: 16 (!) 40  Temp: 37.2 C [id number] C    Complications: No apparent anesthesia complications

## 2016-12-20 NOTE — Anesthesia Preprocedure Evaluation (Addendum)

## 2016-12-21 ENCOUNTER — Encounter (HOSPITAL_COMMUNITY): Payer: Self-pay | Admitting: Gynecologic Oncology

## 2016-12-21 LAB — BASIC METABOLIC PANEL
Anion gap: 6 (ref 5–15)
BUN: 11 mg/dL (ref 6–20)
CALCIUM: 8.5 mg/dL — AB (ref 8.9–10.3)
CO2: 24 mmol/L (ref 22–32)
CREATININE: 0.75 mg/dL (ref 0.44–1.00)
Chloride: 106 mmol/L (ref 101–111)
GFR calc Af Amer: >60 mL/min (ref >60)
GFR calc non Af Amer: >60 mL/min (ref >60)
GLUCOSE: 145 mg/dL — AB (ref 65–99)
Potassium: 4.3 mmol/L (ref 3.5–5.1)
Sodium: 136 mmol/L (ref 135–145)

## 2016-12-21 LAB — CBC
HEMATOCRIT: 35.7 % — AB (ref 36.0–46.0)
Hemoglobin: 11.8 g/dL — ABNORMAL LOW (ref 12.0–15.0)
MCH: 26.5 pg (ref 26.0–34.0)
MCHC: 33.1 g/dL (ref 30.0–36.0)
MCV: 80 fL (ref 78.0–100.0)
Platelets: 369 10*3/uL (ref 150–400)
RBC: 4.46 MIL/uL (ref 3.87–5.11)
RDW: 13.7 % (ref 11.5–15.5)
WBC: 11.6 10*3/uL — ABNORMAL HIGH (ref 4.0–10.5)

## 2016-12-21 NOTE — Progress Notes (Signed)
1 Day Post-Op Procedure(s) (LRB): EXPLORATORY LAPAROTOMY (N/A) HYSTERECTOMY ABDOMINAL WITH BILATERAL  SALPINGO OOPHORECTOMY (Left) OMENTECTOMY WITH LEFT URETEROLYSIS (N/A)  Subjective: Patient reports feeling well this am.  Just ate breakfast with no nausea or emesis reported at this time.  Due to void and due to ambulate in the halls.  Soreness reported but relief with PRN medications.  Denies chest pain, dyspnea, passing flatus, or having a bowel movement.  No concerns voiced.  Objective: Vital signs in last 24 hours: Temp:  [97.9 F (36.6 C)-99.2 F (37.3 C)] 97.9 F (36.6 C) (12/29 0300) Pulse Rate:  [71-94] 79 (12/29 0300) Resp:  [15-40] 16 (12/29 0300) BP: (101-170)/(55-96) 101/55 (12/29 0300) SpO2:  [99 %-100 %] 99 % (12/29 0300) Weight:  [198 lb (89.8 kg)] 198 lb (89.8 kg) (12/28 1441) Last BM Date: 12/21/16  Intake/Output from previous day: 12/28 0701 - 12/29 0700 In: 3790 [P.O.:240; I.V.:3450; IV Piggyback:100] Out: 2745 [Urine:2550; Blood:195]  Physical Examination: General: alert, cooperative and no distress Resp: clear to auscultation bilaterally Cardio: regular rate and rhythm, S1, S2 normal, no murmur, click, rub or gallop GI: soft, non-tender; bowel sounds normal; no masses,  no organomegaly and incision: midline incision with honeycomb dressing in place with no active drainage or erythema noted Extremities: extremities normal, atraumatic, no cyanosis or edema Estrogen patch in place  Labs: WBC/Hgb/Hct/Plts:  11.6/11.8/35.7/369 (12/29 0454) BUN/Cr/glu/ALT/AST/amyl/lip:  11/0.75/--/--/--/--/-- (12/29 0454)  Assessment: 45 y.o. s/p Procedure(s): EXPLORATORY LAPAROTOMY HYSTERECTOMY ABDOMINAL WITH BILATERAL  SALPINGO OOPHORECTOMY OMENTECTOMY WITH LEFT URETEROLYSIS: stable Pain:  Pain is well-controlled on PRN medications.  Heme: Hgb 11.8 and Hct 35.7 this am.  Stable post-operatively.  CV: BP and HR stable post-operatively.  Continue to monitor with ordered  vital sign assessments.  GI:  Tolerating po: Yes.  Antiemetics ordered PRN.  GU: Due to void since foley removal.    FEN: Stable post-operatively.  Prophylaxis: pharmacologic prophylaxis (with any of the following: enoxaparin (Lovenox) 40mg  SQ 2 hours prior to surgery then every day) and intermittent pneumatic compression boots.  Plan: Saline lock IV if diet tolerated Kpad for soreness relief Encourage ambulation, IS use, deep breathing, and coughing Continue post-op plan of care per Dr. Delsa Sale When ready for discharge, the patient is to be discharged to home.   LOS: 1 day    CROSS, MELISSA DEAL 12/21/2016, 8:56 AM

## 2016-12-22 MED ORDER — ESTRADIOL 0.1 MG/24HR TD PTWK: 0.1000 mg | MEDICATED_PATCH | TRANSDERMAL | 12 refills | Status: DC

## 2016-12-22 MED ORDER — OXYCODONE-ACETAMINOPHEN 5-325 MG PO TABS: 1.0000 | ORAL_TABLET | ORAL | 0 refills | Status: DC | PRN

## 2016-12-22 NOTE — Progress Notes (Signed)
Assessment unchanged. Pt verbalized understanding of dc instructions through teach back including when to call the doctor and follow up care. Scripts given as provided by MD. Discharged via wc to front entrance accompanied by family and NT.

## 2016-12-22 NOTE — Care Management Note (Signed)
Case Management Note  Patient Details  Name: Diana Long MRN: [id number] Date of Birth: [birth year]  Subjective/Objective:     hysterectomy               Action/Plan: Discharge Planning: AVS reviewed:  Chart reviewed. No NCM needs identified.   PCP Jefm Petty MD  Expected Discharge Date:  12/22/2016              Expected Discharge Plan:  Home/Self Care  In-House Referral:  NA  Discharge planning Services  CM Consult  Post Acute Care Choice:  NA Choice offered to:  NA  DME Arranged:  N/A DME Agency:  NA  HH Arranged:  NA HH Agency:  NA  Status of Service:  Completed, signed off  If discussed at [place] of Stay Meetings, dates discussed:    Additional Comments:  Erenest Rasher, RN 12/22/2016, 10:17 AM

## 2016-12-22 NOTE — Discharge Summary (Signed)
Physician Discharge Summary  Patient ID: CARISHA JESSE MRN: [id number] DOB/AGE: 44-Jun-1972 44 y.o.  Admit date: 12/20/2016 Discharge date: 12/22/2016  Admission Diagnoses: Right tubo-ovarian mass  Discharge Diagnoses:  Principal Problem:   Right tubo-ovarian mass Active Problems:   Elevated CA-125   Pelvic mass in female   Discharged Condition: good  Hospital Course: On 12/20/2016, the patient underwent the following: Procedure(s): EXPLORATORY LAPAROTOMY HYSTERECTOMY ABDOMINAL WITH BILATERAL  SALPINGO OOPHORECTOMY OMENTECTOMY WITH LEFT URETEROLYSIS.   The postoperative course was uneventful.  She was discharged to home on postoperative day 2 tolerating a regular diet.  Consults: None  Significant Diagnostic Studies: None  Treatments: surgery: see above  Discharge Exam: Blood pressure (!) 110/49, pulse 71, temperature 98 F (36.7 C), temperature source Oral, resp. rate 16, height 5\' 5"  (1.651 m), weight 198 lb (89.8 kg), last menstrual period 11/11/2016, SpO2 97 %. General appearance: alert Resp: clear to auscultation bilaterally Cardio: regular rate and rhythm, S1, S2 normal, no murmur, click, rub or gallop GI: soft, non-tender; bowel sounds normal; no masses,  no organomegaly Extremities: extremities normal, atraumatic, no cyanosis or edema Incision/Wound: C/D/I  Disposition: 01-Home or Self Care  Discharge Instructions    Activity as tolerated - No restrictions    Complete by:  As directed    Call MD for:  extreme fatigue    Complete by:  As directed    Call MD for:  persistant dizziness or light-headedness    Complete by:  As directed    Call MD for:  persistant nausea and vomiting    Complete by:  As directed    Call MD for:  redness, tenderness, or signs of infection (pain, swelling, redness, odor or green/yellow discharge around incision site)    Complete by:  As directed    Call MD for:  severe uncontrolled pain    Complete by:  As directed    Call  MD for:  temperature >100.4    Complete by:  As directed    Diet - low sodium heart healthy    Complete by:  As directed    Diet Carb Modified    Complete by:  As directed    Diet general    Complete by:  As directed    Discharge instructions    Complete by:  As directed    Return to work: 6 weeks  Activity: 1. Be up and out of the bed during the day.  Take a nap if needed.  You may walk up steps but be careful and use the hand rail.  Stair climbing will tire you more than you think, you may need to stop part way and rest.   2. No lifting or straining for 6 weeks.  3. No driving for 1-2 weeks.  Do Not drive if you are taking narcotic pain medicine.  4. Shower daily.  Use soap and water on your incision and pat dry; don't rub.   5. No sexual activity and nothing in the vagina for 4 weeks.  Diet: 1. Low sodium Heart Healthy Diet is recommended.  2. It is safe to use a laxative if you have difficulty moving your bowels.   Wound Care: 1. Keep clean and dry.  Shower daily.  Reasons to call the Doctor:  Fever - Oral temperature greater than 100.4 degrees Fahrenheit Foul-smelling vaginal discharge Difficulty urinating Nausea and vomiting Increased pain at the site of the incision that is unrelieved with pain medicine. Difficulty breathing with or without chest  pain New calf pain especially if only on one side Sudden, continuing increased vaginal bleeding with or without clots.   Follow-up: 1. See Everitt Amber in 4 weeks.  Contacts: For questions or concerns you should contact:  Dr. Everitt Amber at [phone]  or at [facility] [place] [facility] [place] for You After Surgery In addition to the nursing staff on the unit, the gynecological surgery team will care for you. This team is led by your surgeon and includes a resident in his last year of training, as well as other  residents, medical students and a physician assistant or nurse practitioner. There will be a physician in the hospital 24 hours a day to tend to your needs. The residents and students report directly to your surgeon, who is the one overseeing all of your care.  Pain Relief After Surgery Your pain will be assessed regularly on a scale from 0 to 10. Pain assessment is  necessary to guide your pain relief. It is essential that you are able to take deep breaths, cough and move. Prevention or early treatment of pain is far more effective than trying to treat severe pain. Therefore, we have devised a specialized regimen to stay ahead of your pain and use almost no narcotics, which can slow down your recovery process. If you have an epidural catheter, you will receive a  constant infusion of pain medication through your epidural. If you need additional pain relief, you will be able to push a button to increase the medication in your epidural. You will also be given acetaminophen and an ibuprofen-like medication to keep your pain under control.  You can always ask for additional pain pills if you are not comfortable. In most cases an anesthesiologist with expertise in pain management will visit you every day and help design your pain management plan.  One Day After Surgery Focus on drinking and walking. You will start drinking clear liquids after surgery. The intravenous fluids will be stopped, and the catheter may be removed  from your bladder. We expect you to get out of bed, with the nurses' or assistants' help, sit in a chair for six hours and start to move about in the hallways. You will also meet with a case manager to assess your discharge needs, including home nursing. Your physician may order home care to assist with your transition home.  Home nursing visits, which are intermittent, help you get readjusted to home by teaching treatments, monitoring medications, and performing clinical  assessment and reporting back to your physician. Other services may include therapy and medical equipment; private duty services are also available. If you are going "home" to a different address upon discharge, please alert [place]. A Home Care Coordinator can visit with you while in the hospital to discuss your options. If you have questions please speak with your case manager. If you need rehabilitation at a facility, a social worker will assist with this. If you need rehabilitation at a facility, a social worker will assist with this. If your procedure was performed in a minimally invasive fashion, you will be discharged to home if your pain is well controlled and you are tolerating a regular diet.     Two Days After Surgery You will start eating a soft diet and change to a more solid diet as you feel up to it. The catheter from your bladder will be removed, if  not already done so. If there is a dressing on your wound, it will be removed. The tubing will be disconnected from your IV. We expect you to be out of bed for the majority of the day and walking at least three times in the hallway, with assistance as needed.  You may be discharged at this point if it is felt you are ready.   Three Days After Surgery You continue to eat your low residue diet. You may be ready to go home if you are drinking enough to keep yourself hydrated, your pain is well controlled, you are not belching or nauseated, you are passing gas and you are able to get around on your own. However, we will not discharge you from the hospital until we are sure you are ready.  Discharge Discharge time is at 10 a.m. You will need to make arrangements for someone to accompany you home. You will not be released without someone present. Please keep in mind that we strive to get patients discharged as quickly as possible, but there may be delays for a variety of reasons. Complications That May Delay Discharge: ? Nausea and  vomiting: It is very common to feel sick after your surgery. We give you medication to reduce this. However, if you do feel sick, you should reduce the amount you are taking by mouth. Small, frequent meals or drinks are best in this  situation. As long as you can drink and keep yourself hydrated, the nausea will likely pass.  Ileus: Following surgery, the bowel can be sluggish, making it difficult for food and gas to pass through the intestines. This is called an ileus. We have designed our care program to do everything possible to reduce the likelihood of an ileus. If you do develop an ileus, it usually only lasts two to three days. However, it may require a small tube down the nose to decompress the stomach. The best way to avoid an ileus is to reduce the amount of narcotic pain medications, get up as much as possible after your surgery, and stimulate the bowel early after surgery with small amounts of food and liquids.  Wound infection: If a wound infection develops, this usually happens three to ten days after surgery.    Urinary retention: This is if you are unable to urinate after the catheter from your bladder is removed. The catheter may need to be reinserted until you are able to urinate on your own. This can be caused by anesthesia, pain medication and decreased activity.    When you are preparing to go home, you will receive:  Detailed discharge instructions, with information about your operation and medications    All prescriptions for medications you need at home; prescriptions can be filled while you are in the hospital if you would like    You may be prescribed Lovenox. Lovenox is used to reduce the risk of developing a blood clot after surgery. An appointment to see your surgeon or provider one to two weeks after you leave the hospital for follow-up   After Discharge Once you are discharged: Call us at any time if you are worried about your recovery or if you  should have any questions. During regular office hours, (8:30 a.m.-4:30 p.m.), and after hours call [phone].  Call us immediately if:  You have a fever higher than 100.4 degrees.   Your wound is red, more painful or has drainage.    You are nauseated, vomiting or can't keep  liquids down.    Your pain is worse and not able to be controlled with the regimen you were sent home with.    If you are bleeding heavily or have a lot of fluid coming from your vagina. If you are on narcotics, the goal is to wean you off of them. If you are running low on supply and need more, call the nurse a few days before you will run out.  It is generally easier to reach someone between 8:30 a.m. - 4:30 p.m., so call early if you think something is not right. A nurse or nurse practitioner is available every day to answer your questions. After hours and on the weekends, the calls go to the resident doctors in the hospital. It may take longer for your phone call to be returned during this time. If you have a true emergency, such as severe abdominal pain, chest pain, shortness of breath or any other acute issues, call 911 and go to the local emergency room. Have them contact our team once you are stable.  Concerns After Discharge Bowel Function Following Your Surgery Your bowels will take several weeks to settle down and may be unpredictable at first. Your bowel movements may become loose, or you may be constipated. For the vast number of patients, this will get back to normal with time. Make sure you eat nutritious meals, drink plenty of fluids and take regular walks during the first two weeks after your operation. Your Guide to Gynecologic Surgery   Abdominal Pain It is not unusual to suffer gripping pains (colic) during the first week following removal of a portion of your bowel. This pain usually lasts for a few minutes but goes away between spasms. If you have severe pain lasting more than  one to two hours or have a fever and feel generally unwell, you should contact us at  the telephone contact numbers listed at the end of this packet. Hysterectomy: You should have pelvic rest for six (6) weeks or as specified by your doctor after surgery. You should have nothing in the vagina (no tampons, douching, intercourse, etc.,) during this time period. If you have some vaginal spotting, this is normal. If you have heavy bleeding or  a lot of fluid from your vagina, this is NOT normal and you should contact your doctor's office or, if after hours, contact the doctor on call.  Diarrhea: Fiber and Imodium (Loperamide) The first step to improving your frequent or loose stools is to bulk up the stool with fiber. Metamucil is the most common type of fiber that is available at any drug store. Start with 1 teaspoon mixed into food, like yogurt or oatmeal, in the morning and evening. Try not to drink any fluid for one hour after you take the fiber. This will allow the fiber to act like a sponge in your intestines, soaking up all the excess water. Continue this for three to five days. You may increase by 1 teaspoon every three to five days until the desired affect, or you are at 1 tablespoon (3 teaspoons) twice a day. If this doesn't work, you may try over-the-counter Loperamide, which is an antidiarrheal medication. You may take one tablet in the morning and evening or 30 minutes before you typically have diarrhea. You may take up to eight of these tablets daily. It is best to discuss this with [place] prior to using this medication. If you have continuous diarrhea and abdominal cramping, call [phone].  Foley  Catheter Your surgeon may recommend you be discharged home with a foley catheter (bladder catheter) for 1 to 2 weeks. Typically this recommendation will be made for patients undergoing surgery to the lower urinary tract. Before you leave the hospital, your nurse should outfit you with  a clip on the inner thigh to secure the catheter to prevent pulling as well as a small bag that can be easily worn on the upper leg under loose fitting pants and skirts. Your nurse will teach you how to exchange the large bag that typically comes with the catheter for the small bag. You may find it convenient to attach the small bag when active during the day and then the large bag when sleeping at night. If there is ever a point when you notice the catheter is not draining urine and youbegin to develop pain behind/above the pubic bone, you should report to the clinic or emergency room immediately as the catheter may be kinked or clogged. Kinking or clogging of the catheter prevents urine from draining from your bladder. Urine will quickly build up in the bladder and can cause severe pain as well as seriously disrupt healing if you have undergone surgery on the lower urinary tract. Additionally, pulling on the catheter can result in displacement of the balloon at the end of the catheter from inside of to outside  of the bladder. This also results in severe pain and can cause bleeding. For this reason, secure the catheter to the clip on your inner thigh at all times as the clip prevents against pulling.  Wound Care For the first few weeks following surgery, your wound may be slightly red and uncomfortable. You may shower and let the soapy water wash over your incision. Avoid soaking in the tub for one month following surgery or until the wound is well healed. It will take the wound several months to "soften." It is common to have bumpy areas in the wound near the belly  button and at the ends of the incision.  If you have staples, these should be removed when you are seen by your surgeon at the follow-up appointment. You may have a glue-like material on your incision. Do not pick at this. It will come off over time. It is the surgical glue used in surgery to close your incision. You also have  sutures inside of you that will dissolve over time  Post-Surgery Diet Attention to good nutrition after surgery is important to your recovery. If you had no dietary restrictions prior to the surgery, you will have no special dietary restrictions after the surgery. However, consuming enough protein, calories, vitamins and minerals is necessary to support healing. Some patients find their appetite is less than normal after surgery. In this case, frequent small meals throughout the day may help. It is not uncommon to lose 10 to 15 pounds after surgery. However, by the fourth to fifth week, your weight loss should stabilize. It is normal that certain foods taste different and certain smells may make you nauseas. Over time, the amount you can comfortably consume will gradually increase. You should try to eat a balanced diet, which includes:  Foods that are soft, moist, and easy to chew and swallow    Canned or soft-cooked fruits and vegetables   Plenty of soft breads, rice, pasta, potatoes and other starchy foods (lowerfiber  varieties may be tolerated better initially)    High-protein foods and beverages, such as meats, eggs, milk, cottage cheese  or a  supplemental nutrition drink like Boost or Ensure    Drink plenty of fluids-at least 8 to 10 cups per day. This includes water,  fruit juice, Gatorade, teas/coffee and milk. Drinking plenty is especially important if you have loose stools (diarrhea).   Avoid drinking a lot of caffeine, since this may dehydrate you.    Avoid fried, greasy and highly seasoned or spicy foods.    Avoid carbonated beverages in the first couple weeks.    Avoid raw fruits and vegetables.   Hobbies/Activities Walking is encouraged after your surgery. You should plan to undertake regular exercise several times a day and gradually increase this during the four weeks following your operation until you are back to your normal level of activity. You may climb  stairs. Don't do any heavy lifting greater than 10 pounds or contact sports for the first month after your surgery. Generally, you can return to hobbies and activities soon after your surgery. This will help you recover. It can take up to two to three months to fully recover. It is not unusual to be fatigued and require an afternoon nap for up to six to eight weeks following surgery. Your body is using this energy to heal your wounds. Set small goals for yourself and try to do a little more each day.  Work It is normal to return to work three to six weeks following your operation. If your job involves heavy manual work, then you should wait six weeks. However, you should check with your employer regarding rules, which may be relevant to your return to work. If you need a return-to-work form for your employer or disability papers, bring them to your follow-up appointment or fax them to our office at [phone].  Driving You may drive when you are off narcotics and pain-free enough to react quickly with your braking foot. For most patients, this occurs at one to four weeks following surgery.   Write down any questions you may have to ask your care team.  Important Contact Numbers: GYN Oncology Office: [phone]   Discharge wound care:    Complete by:  As directed    Keep clean and dry   Driving Restrictions    Complete by:  As directed    No driving for 1- 2 weeks   Increase activity slowly    Complete by:  As directed    Lifting restrictions    Complete by:  As directed    No lifting > 5 lbs for 6 weeks   May shower / Bathe    Complete by:  As directed    No tub baths for 6 weeks   Sexual Activity Restrictions    Complete by:  As directed    No intercourse for 6 - 8 weeks     Allergies as of 12/22/2016   No Known Allergies     Medication List    TAKE these medications   estradiol 0.1 mg/24hr patch Commonly known as:  CLIMARA - Dosed in mg/24 hr Place 1 patch  (0.1 mg total) onto the skin once a week. Start taking on:  12/28/2016   ferrous sulfate 325 (65 FE) MG tablet Take 325 mg by mouth every evening.   ibuprofen 200 MG tablet Commonly known as:  ADVIL,MOTRIN Take 400 mg by mouth every 6 (six) hours as needed for fever or mild pain.   oxyCODONE-acetaminophen 5-325 MG tablet Commonly known as:  PERCOCET/ROXICET Take 1-2 tablets by mouth every 4 (four) hours  as needed for severe pain.   vitamin C 500 MG tablet Commonly known as:  ASCORBIC ACID Take 500 mg by mouth every evening.      Follow-up Information    Donaciano Eva, MD Follow up on 01/16/2017.   Specialty:  Obstetrics and Gynecology Why:  at 2pm at the [facility] information: [place] [address] [phone]           Signed: Agnes Lawrence 12/22/2016, 11:45 AM

## 2017-01-16 ENCOUNTER — Ambulatory Visit: Payer: 59 | Attending: Gynecologic Oncology | Admitting: Gynecologic Oncology

## 2017-01-16 DIAGNOSIS — N393 Stress incontinence (female) (male): Secondary | ICD-10-CM | POA: Insufficient documentation

## 2017-01-16 DIAGNOSIS — D3912 Neoplasm of uncertain behavior of left ovary: Secondary | ICD-10-CM | POA: Insufficient documentation

## 2017-01-16 DIAGNOSIS — Z90710 Acquired absence of both cervix and uterus: Secondary | ICD-10-CM | POA: Insufficient documentation

## 2017-01-16 DIAGNOSIS — Z90722 Acquired absence of ovaries, bilateral: Secondary | ICD-10-CM | POA: Insufficient documentation

## 2017-01-16 DIAGNOSIS — N801 Endometriosis of ovary: Secondary | ICD-10-CM | POA: Insufficient documentation

## 2017-01-16 DIAGNOSIS — Z803 Family history of malignant neoplasm of breast: Secondary | ICD-10-CM | POA: Diagnosis not present

## 2017-01-16 DIAGNOSIS — Z48816 Encounter for surgical aftercare following surgery on the genitourinary system: Secondary | ICD-10-CM | POA: Diagnosis present

## 2017-01-16 DIAGNOSIS — E28310 Symptomatic premature menopause: Secondary | ICD-10-CM

## 2017-01-16 DIAGNOSIS — D3910 Neoplasm of uncertain behavior of unspecified ovary: Secondary | ICD-10-CM

## 2017-01-16 MED ORDER — ESTRADIOL 0.05 MG/24HR TD PTTW: 1.0000 | MEDICATED_PATCH | TRANSDERMAL | 12 refills | Status: AC

## 2017-01-16 NOTE — Patient Instructions (Signed)
Follow up with Dr Eliane Decree.

## 2017-01-16 NOTE — Progress Notes (Signed)
Follow-up Note: Gyn-Onc  Consult was initially requested by Dr. Ellyn Hack for the evaluation of Diana Long 46 y.o. female  CC:  Chief Complaint  Patient presents with  . low malignant potential tumor    postoperative follow-up and counseling    Assessment/Plan:  Diana Long  is a 46 y.o.  year old with a large (12cm) solid and cystic right ovarian mass and abnormal uterine bleeding.  She declined biopsy of the endometrium today due to virginal status.  Recommend surgery with ex lap, RSO, possible hysterectomy (vs D&C) and LSO with staging if malignancy is identified. I discussed that if hysterectomy is performed she will no longer be able to carry a pregnancy.  I discussed operative risks including  bleeding, infection, damage to internal organs (such as bladder,ureters, bowels), blood clot, reoperation and rehospitalization.  Surgery is scheduled for 12/20/16.   HPI: Diana Long is a 46 year old G0 virginal woman who is seen in consultation at the request of Dr Ellyn Hack for a large right ovarian mass.  The patient experienced severe sudden onset left abdominal pains in early December 2017. She was seen at [facility] where CT imaging and US of the pelvis confirmed a uterus measuring 10x5x5.3cm and a 2cm right lateral mid body fibroid. THere was a 12cm right ovarian mass which was complex and cystic and contained mural soft tissue thickening with papillary appearance. No free fluid, omental disease, carcinomatosis or lymphadenopathy was seen.   Her pain resolved within 12 hours and she has been painfree since.  She reports some abnormal irregular vaginal bleeding in the past year which has normalized more recently. She has never been pregnant nor has she been sexually active. She is very healthy and has had no prior abdominal surgeries. She has a family history of breast cancer, but has been personally BRCA tested and was negative.   Interval Hx:  CA 125  preop on 12/10/16 was elevated at 477.  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 She had an uncomplicated postoperative course.  Her major complaint postop is the size of the estradiol patch is uncomfortable.  She reports no bleeding. She has some new stress urinary incontinence.  Current Meds:  Outpatient Encounter Prescriptions as of 01/16/2017  Medication Sig  . [START ON 01/17/2017] estradiol (VIVELLE-DOT) 0.05 MG/24HR patch Place 1 patch (0.05 mg total) onto the skin 2 (two) times a week.  . ferrous sulfate 325 (65 FE) MG tablet Take 325 mg by mouth every evening.   Marland Kitchen ibuprofen (ADVIL,MOTRIN) 200 MG tablet Take 400 mg by mouth every 6 (six) hours as needed for fever or mild pain.  Marland Kitchen oxyCODONE-acetaminophen (PERCOCET/ROXICET) 5-325 MG tablet Take 1-2 tablets by mouth every 4 (four) hours as needed for severe pain.  . vitamin C (ASCORBIC ACID) 500 MG tablet Take 500 mg by mouth every evening.   . [DISCONTINUED] estradiol (CLIMARA - DOSED IN MG/24 HR) 0.1 mg/24hr patch Place 1 patch (0.1 mg total) onto the skin once a week.   No facility-administered encounter medications on file as of 01/16/2017.     Allergy: No Known Allergies  Social Hx:   Social History   Social History  . Marital status: Single    Spouse name: N/A  . Number of children: N/A  . Years of  education: N/A   Occupational History  . Not on file.   Social History Main Topics  . Smoking status: Never Smoker  . Smokeless tobacco: Never Used  . Alcohol use Yes     Comment: social  . Drug use: No  . Sexual activity: Yes    Birth control/ protection: None   Other Topics Concern  . Not on file   Social History Narrative  . No narrative on file    Past Surgical Hx:  Past Surgical History:  Procedure  Laterality Date  . ABDOMINAL HYSTERECTOMY Left 12/20/2016   Procedure: HYSTERECTOMY ABDOMINAL WITH BILATERAL  SALPINGO OOPHORECTOMY;  Surgeon: Everitt Amber, MD;  Location: [facility];  Service: Gynecology;  Laterality: Left;  . LAPAROTOMY N/A 12/20/2016   Procedure: EXPLORATORY LAPAROTOMY;  Surgeon: Everitt Amber, MD;  Location: [facility];  Service: Gynecology;  Laterality: N/A;  . NO PAST SURGERIES    . OMENTECTOMY N/A 12/20/2016   Procedure: OMENTECTOMY WITH LEFT URETEROLYSIS;  Surgeon: Everitt Amber, MD;  Location: [facility];  Service: Gynecology;  Laterality: N/A;    Past Medical Hx:  Past Medical History:  Diagnosis Date  . Headache    hx of migraines years ago  . Medical history non-contributory     Past Gynecological History:   No LMP recorded.  Family Hx: No family history on file.  Review of Systems:  Constitutional  Feels well,    ENT Normal appearing ears and nares bilaterally Skin/Breast  No rash, sores, jaundice, itching, dryness Cardiovascular  No chest pain, shortness of breath, or edema  Pulmonary  No cough or wheeze.  Gastro Intestinal  No nausea, vomitting, or diarrhoea. No bright red blood per rectum, no abdominal pain, change in bowel movement, or constipation.  Genito Urinary  No frequency, urgency, dysuria, no pain. Musculo Skeletal  No myalgia, arthralgia, joint swelling or pain  Neurologic  No weakness, numbness, change in gait,  Psychology  No depression, anxiety, insomnia.   Vitals:  Blood pressure (!) 122/51, pulse 89, temperature 98.4 F (36.9 C), temperature source Oral, resp. rate 17, height '5\' 5"'$  (1.651 m), weight 204 lb 14.4 oz (92.9 kg), SpO2 100 %.  Physical Exam: WD in NAD Neck  Supple NROM, without any enlargements.  Lymph Node Survey No cervical supraclavicular or inguinal adenopathy Cardiovascular  Pulse normal rate, regularity and rhythm. S1 and S2 normal.  Lungs  Clear to auscultation bilateraly, without wheezes/crackles/rhonchi. Good air  movement.  Skin  No rash/lesions/breakdown  Psychiatry  Alert and oriented to person, place, and time  Abdomen  Normoactive bowel sounds, abdomen soft, non-tender and overweight without evidence of hernia. Incision well healed. Back No CVA tenderness Genito Urinary: vaginal cuff healing normally with no blood. Rectal  deferred Extremities  No bilateral cyanosis, clubbing or edema.   30 minutes of direct face to face counseling time was spent with the patient. This included discussion about prognosis, therapy recommendations and postoperative side effects and are beyond the scope of routine postoperative care.   Donaciano Eva, MD  01/16/2017, 2:58 PM

## 2017-01-17 ENCOUNTER — Encounter: Payer: Self-pay | Admitting: Gynecologic Oncology

## 2017-02-26 IMAGING — CT CT RENAL STONE PROTOCOL
2 of 4 series · 16 of 46 positions shown, 18 images · non-contrast
Comparison: None.

CLINICAL DATA: Sudden onset of left flank pain today.

EXAM:
CT ABDOMEN AND PELVIS WITHOUT CONTRAST
TECHNIQUE: Multidetector CT imaging of the abdomen and pelvis was performed
following the standard protocol without IV contrast.

[Series 2: axial st · axial · 0.98mm/px · z∈[-610,-170]mm · 13 of 96 slices shown, 15 images]
[im 4/96  soft-tissue]
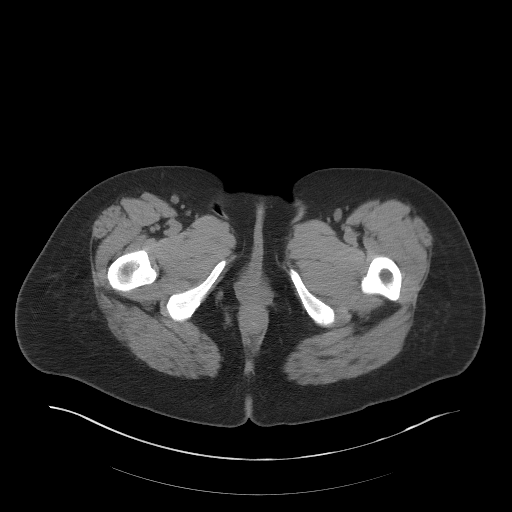
[im 4/96  bone]
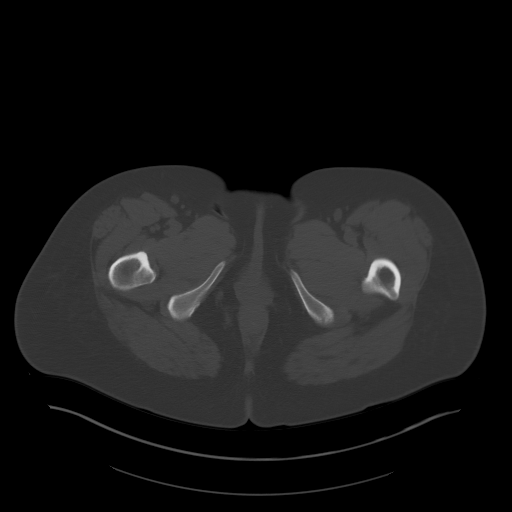
[im 12/96  soft-tissue]
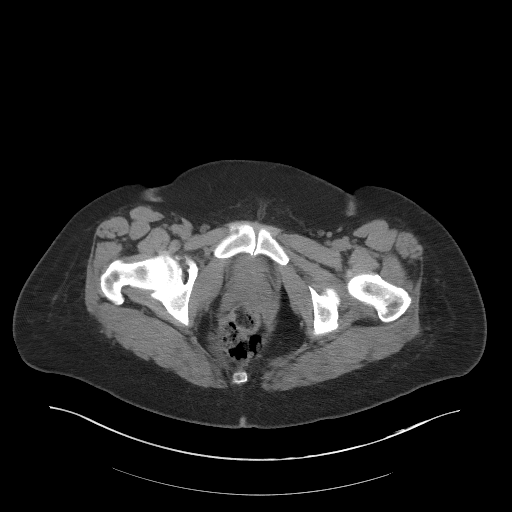
[im 20/96  soft-tissue]
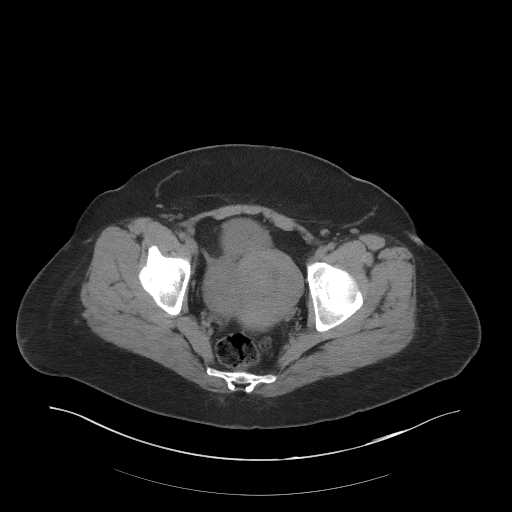
[im 28/96  soft-tissue]
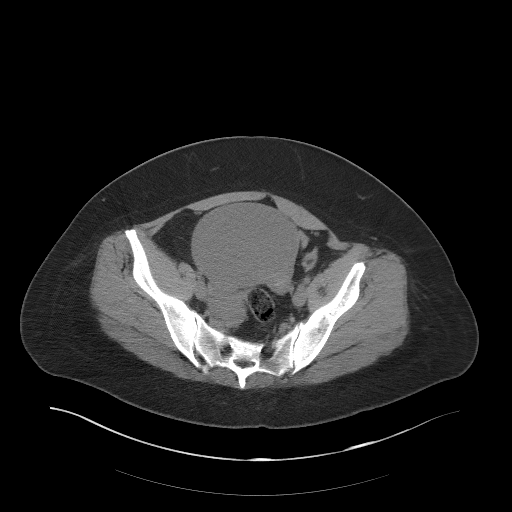
[im 32/96  soft-tissue]
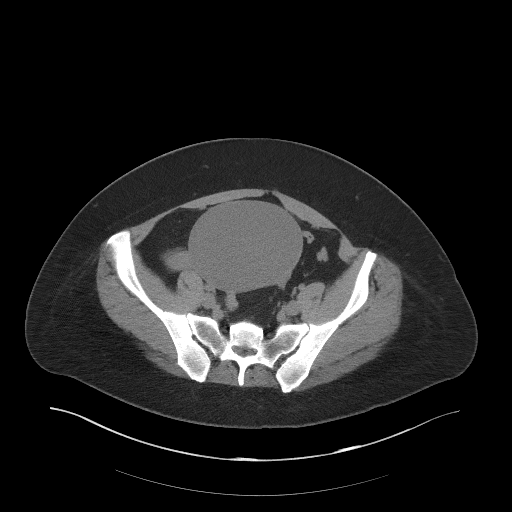
[im 40/96  soft-tissue]
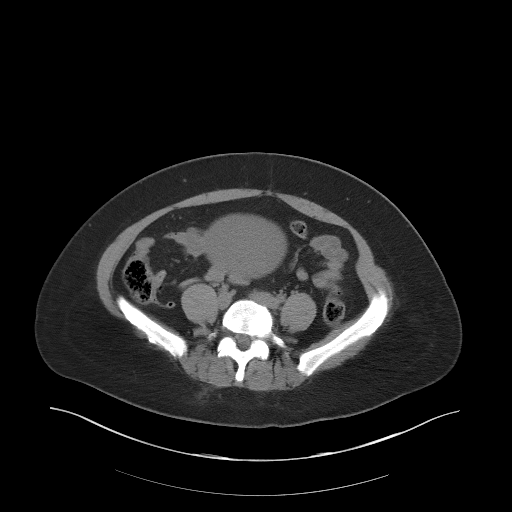
[im 48/96  soft-tissue]
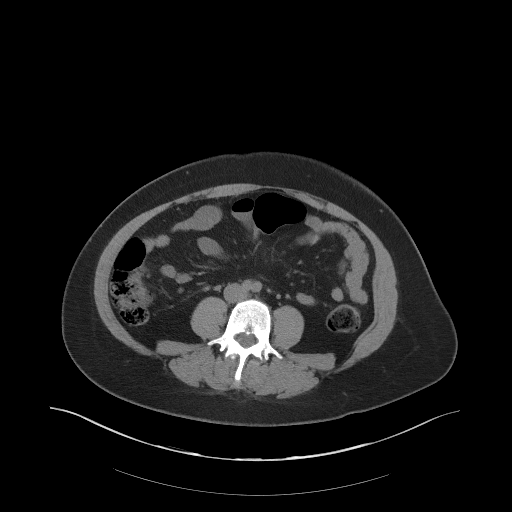
[im 56/96  soft-tissue]
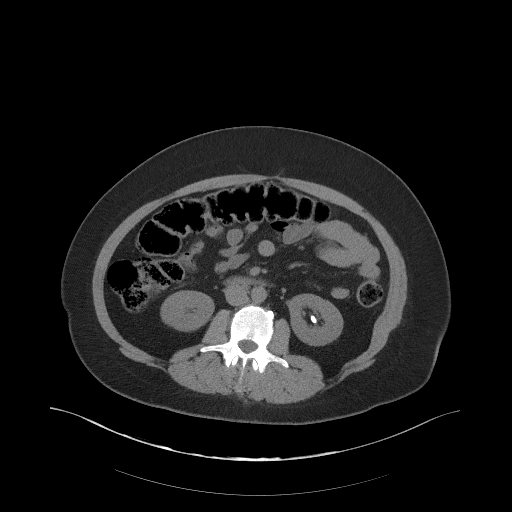
[im 64/96  soft-tissue]
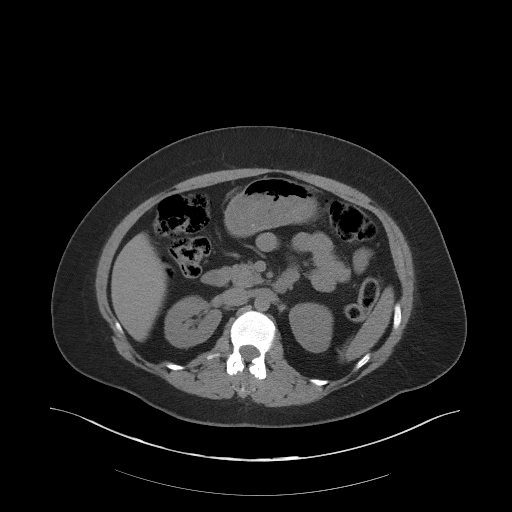
[im 64/96  bone]
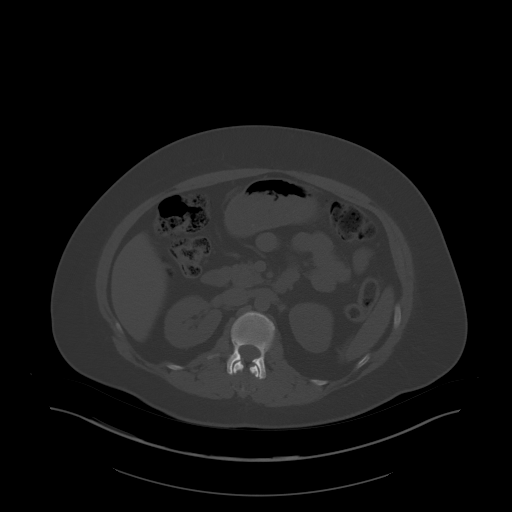
[im 68/96  soft-tissue]
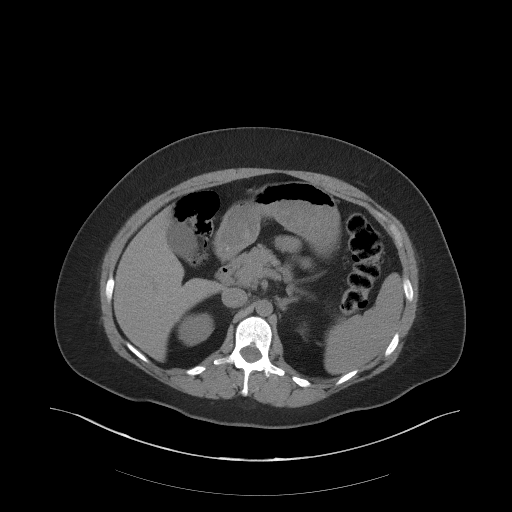
[im 76/96  soft-tissue]
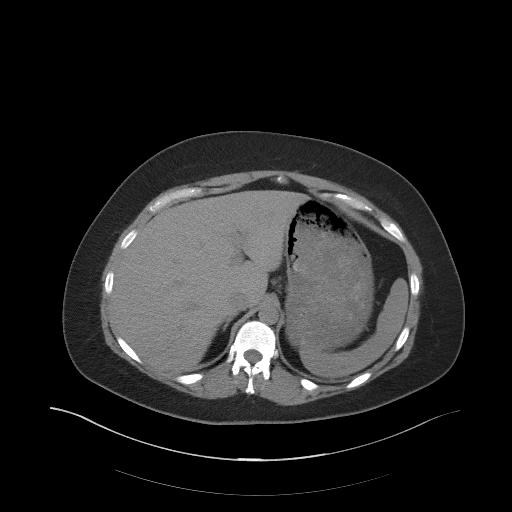
[im 84/96  soft-tissue]
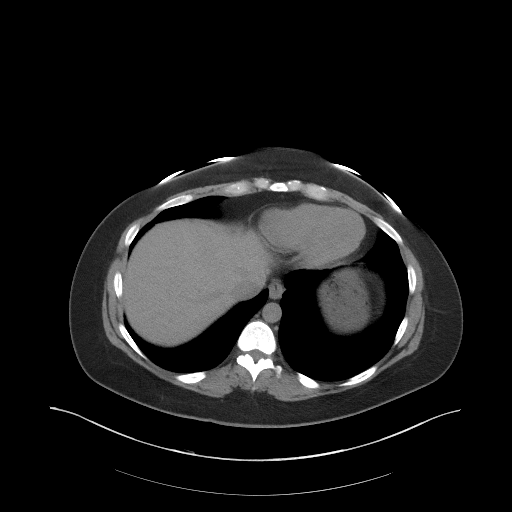
[im 92/96  soft-tissue]
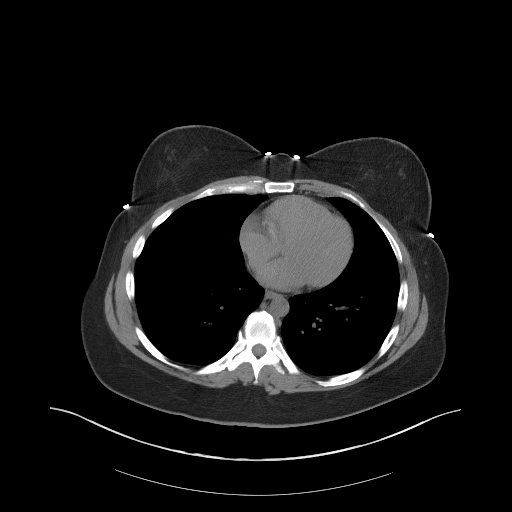

[Series 4: coronal st · coronal · 0.95mm/px · 3 of 78 slices shown]
[im 26/78  soft-tissue]
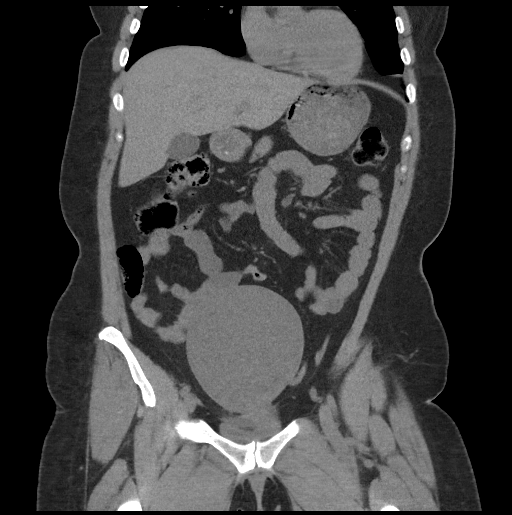
[im 35/78  soft-tissue]
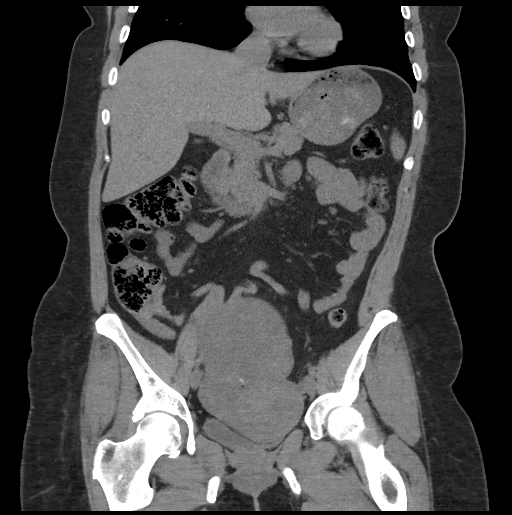
[im 43/78  soft-tissue]
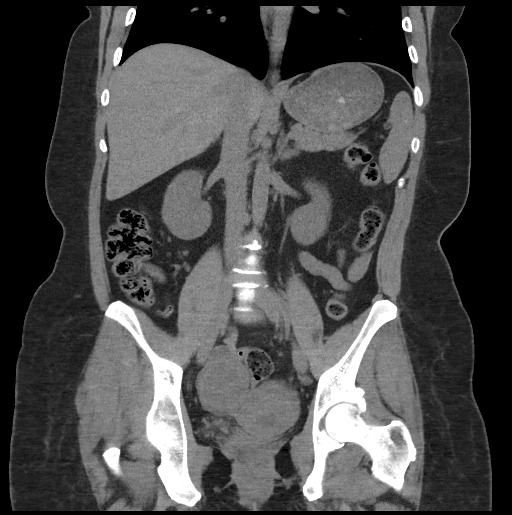

[16 of 46 positions shown; findings below may reference images not displayed]

FINDINGS: Lower chest: The lung bases are clear.

Hepatobiliary: 18 mm subcapsular hypodensity in the inferior right
lobe of the liver is incompletely characterized without contrast.
Probable fatty infiltration adjacent the gallbladder fossa.
Gallbladder physiologically distended, no calcified stone. No
biliary dilatation.

Pancreas: Unremarkable. No pancreatic ductal dilatation or
surrounding inflammatory changes.

Spleen: Normal in size without focal abnormality.

Adrenals/Urinary Tract: Nonobstructing 8 x 9 mm stone in the lower
pole of the left kidney. No additional urolithiasis. No
hydronephrosis or perinephric edema. Ureters are decompressed.
Urinary bladder is decompressed.

Stomach/Bowel: Stomach is distended with ingested contents. Appendix
appears normal. No evidence of bowel wall thickening, distention, or
inflammatory changes.

Vascular/Lymphatic: Small mesenteric nodes in the ileocolic chain.
No retroperitoneal or pelvic adenopathy. No vascular abnormality.

Reproductive: Large midline pelvic mass may be arising from the left
ovary, measuring 11 x 12 x 9 cm. This measures soft tissue density.
Question of adjacent free fluid or inflammation. Tentatively
identified right ovary is enlarged measuring 6.0 x 5.1 x 5.4 cm.
There is a small calcified density between the midline pelvic mass
and right ovary. The uterus is prominent size, bulbous and
heterogeneous with soft tissue density causing mass effect on fluid
in the endometrial canal.

Other: Other than the small free fluid adjacent to midline mass, no
free fluid. No free air.

Musculoskeletal: There are no acute or suspicious osseous
abnormalities. Mild facet degeneration.
IMPRESSION: 1. Large midline pelvic mass measuring at least 12 cm, the etiology
is uncertain, however may be arising from the left ovary versus less
likely uterine fibroid. Tentatively identified right ovary is also
prominent size. Recommend pelvic ultrasound with Doppler for
characterization and evaluate for ovarian torsion, however large
size of the pelvic mass may limit sonographic sensitivity.
2. Bulbous uterus with possible fibroids separate from the pelvic
mass.
3. Nonobstructing left nephrolithiasis.
These results were called by telephone at the time of interpretation
on 11/29/2016 at [DATE] to Dr. CHETIOUI HOSAM , who verbally
acknowledged these results.

## 2017-09-20 ENCOUNTER — Emergency Department (HOSPITAL_BASED_OUTPATIENT_CLINIC_OR_DEPARTMENT_OTHER)
Admission: EM | Admit: 2017-09-20 | Discharge: 2017-09-21 | Disposition: A | Discharge status: Home / Self Care | Payer: 59 | Attending: Emergency Medicine | Admitting: Emergency Medicine

## 2017-09-20 ENCOUNTER — Encounter (HOSPITAL_BASED_OUTPATIENT_CLINIC_OR_DEPARTMENT_OTHER): Payer: Self-pay | Admitting: *Deleted

## 2017-09-20 DIAGNOSIS — R109 Unspecified abdominal pain: Secondary | ICD-10-CM | POA: Diagnosis present

## 2017-09-20 DIAGNOSIS — Y999 Unspecified external cause status: Secondary | ICD-10-CM | POA: Insufficient documentation

## 2017-09-20 DIAGNOSIS — Y939 Activity, unspecified: Secondary | ICD-10-CM | POA: Insufficient documentation

## 2017-09-20 DIAGNOSIS — R197 Diarrhea, unspecified: Secondary | ICD-10-CM

## 2017-09-20 DIAGNOSIS — Y929 Unspecified place or not applicable: Secondary | ICD-10-CM | POA: Insufficient documentation

## 2017-09-20 DIAGNOSIS — X58XXXA Exposure to other specified factors, initial encounter: Secondary | ICD-10-CM | POA: Insufficient documentation

## 2017-09-20 DIAGNOSIS — Z79899 Other long term (current) drug therapy: Secondary | ICD-10-CM | POA: Diagnosis not present

## 2017-09-20 DIAGNOSIS — T148XXA Other injury of unspecified body region, initial encounter: Secondary | ICD-10-CM | POA: Diagnosis not present

## 2017-09-20 DIAGNOSIS — N200 Calculus of kidney: Secondary | ICD-10-CM

## 2017-09-20 NOTE — ED Triage Notes (Addendum)
Alert, NAD, calm, interactive, resps e/u, speaking in clear complete sentences, no dyspnea noted, skin W&D, VSS. Family at [facility].  C/o LLQ/side pain, radiates around to back, "don't think it is my kidneys, feels like a pulled muscle", also reports nausea and a little diarrhea, (denies: fever, vomiting, bleeding, frequency, urgency, dysuria), took aleve w/o relief, and robaxin with some relief, but pain gradually came back. Some relief with heat and ice.

## 2017-09-21 ENCOUNTER — Emergency Department (HOSPITAL_BASED_OUTPATIENT_CLINIC_OR_DEPARTMENT_OTHER): Payer: 59

## 2017-09-21 LAB — URINALYSIS, ROUTINE W REFLEX MICROSCOPIC
Bilirubin Urine: NEGATIVE
GLUCOSE, UA: NEGATIVE mg/dL
HGB URINE DIPSTICK: NEGATIVE
Ketones, ur: NEGATIVE mg/dL
Leukocytes, UA: NEGATIVE
Nitrite: NEGATIVE
Protein, ur: NEGATIVE mg/dL
SPECIFIC GRAVITY, URINE: 1.02 (ref 1.005–1.030)
pH: 6 (ref 5.0–8.0)

## 2017-09-21 LAB — BASIC METABOLIC PANEL
Anion gap: 7 (ref 5–15)
BUN: 29 mg/dL — AB (ref 6–20)
CHLORIDE: 105 mmol/L (ref 101–111)
CO2: 22 mmol/L (ref 22–32)
Calcium: 8.6 mg/dL — ABNORMAL LOW (ref 8.9–10.3)
Creatinine, Ser: 1.67 mg/dL — ABNORMAL HIGH (ref 0.44–1.00)
GFR calc Af Amer: 42 mL/min — ABNORMAL LOW (ref >60)
GFR calc non Af Amer: 36 mL/min — ABNORMAL LOW (ref >60)
Glucose, Bld: 115 mg/dL — ABNORMAL HIGH (ref 65–99)
POTASSIUM: 3.9 mmol/L (ref 3.5–5.1)
Sodium: 134 mmol/L — ABNORMAL LOW (ref 135–145)

## 2017-09-21 MED ORDER — DEXAMETHASONE SODIUM PHOSPHATE 10 MG/ML IJ SOLN
4.0000 mg | Freq: Once | INTRAMUSCULAR | Status: AC
  Administered 2017-09-21: 4 mg via INTRAVENOUS
  Filled 2017-09-21: qty 1

## 2017-09-21 MED ORDER — ONDANSETRON HCL 4 MG/2ML IJ SOLN
4.0000 mg | Freq: Once | INTRAMUSCULAR | Status: AC
  Administered 2017-09-21: 4 mg via INTRAVENOUS
  Filled 2017-09-21: qty 2

## 2017-09-21 MED ORDER — SODIUM CHLORIDE 0.9 % IV BOLUS (SEPSIS)
1000.0000 mL | Freq: Once | INTRAVENOUS | Status: AC
  Administered 2017-09-21: 1000 mL via INTRAVENOUS

## 2017-09-21 MED ORDER — KETOROLAC TROMETHAMINE 30 MG/ML IJ SOLN
15.0000 mg | Freq: Once | INTRAMUSCULAR | Status: AC
  Administered 2017-09-21: 15 mg via INTRAVENOUS
  Filled 2017-09-21: qty 1

## 2017-09-21 MED ORDER — OXYCODONE-ACETAMINOPHEN 5-325 MG PO TABS: 1.0000 | ORAL_TABLET | Freq: Four times a day (QID) | ORAL | 0 refills | Status: DC | PRN

## 2017-09-21 MED ORDER — FENTANYL CITRATE (PF) 100 MCG/2ML IJ SOLN: 100.0000 ug | Freq: Once | INTRAMUSCULAR | Status: DC

## 2017-09-21 MED ORDER — ONDANSETRON 8 MG PO TBDP: ORAL_TABLET | ORAL | 0 refills | Status: DC

## 2017-09-21 NOTE — ED Notes (Signed)
Page made to Dr Tresa Moore for Urology consult

## 2017-09-21 NOTE — ED Provider Notes (Signed)
[place] [facility] Provider Note   CSN: [id number] Arrival date & time: 09/20/17  2308     History   Chief Complaint Chief Complaint  Patient presents with  . Abdominal Pain    HPI Diana Long is a 46 y.o. female.  The history is provided by the patient.  Back Pain   This is a new problem. The current episode started 2 days ago. The problem occurs constantly. The problem has not changed since onset.The pain is associated with no known injury. The pain is present in the sacro-iliac joint. The quality of the pain is described as stabbing. The pain does not radiate. The pain is severe. The symptoms are aggravated by certain positions. The pain is the same all the time. Pertinent negatives include no chest pain, no fever and no abdominal pain. Associated symptoms comments: Diarrhea and nausea and myalgia . She has tried nothing for the symptoms. The treatment provided no relief. Risk factors include obesity.    Past Medical History:  Diagnosis Date  . Headache    hx of migraines years ago  . Medical history non-contributory     Patient Active Problem List   Diagnosis Date Noted  . Ovarian low malignant potential tumor 01/16/2017  . Elevated CA-125 12/20/2016  . Pelvic mass in female 12/20/2016  . Right tubo-ovarian mass 12/10/2016    Past Surgical History:  Procedure Laterality Date  . ABDOMINAL HYSTERECTOMY Left 12/20/2016   Procedure: HYSTERECTOMY ABDOMINAL WITH BILATERAL  SALPINGO OOPHORECTOMY;  Surgeon: Everitt Amber, MD;  Location: [facility];  Service: Gynecology;  Laterality: Left;  . LAPAROTOMY N/A 12/20/2016   Procedure: EXPLORATORY LAPAROTOMY;  Surgeon: Everitt Amber, MD;  Location: [facility];  Service: Gynecology;  Laterality: N/A;  . NO PAST SURGERIES    . OMENTECTOMY N/A 12/20/2016   Procedure: OMENTECTOMY WITH LEFT URETEROLYSIS;  Surgeon: Everitt Amber, MD;  Location: [facility];  Service: Gynecology;  Laterality: N/A;    OB History    Gravida Para Term Preterm AB  Living   0 0 0 0 0 0   SAB TAB Ectopic Multiple Live Births   0 0 0 0 0       Home Medications    Prior to Admission medications   Medication Sig Start Date End Date Taking? Authorizing Provider  LEVOTHYROXINE SODIUM PO Take by mouth.   Yes [provider]  estradiol (VIVELLE-DOT) 0.05 MG/24HR patch Place 1 patch (0.05 mg total) onto the skin 2 (two) times a week. 01/17/17   Everitt Amber, MD  ferrous sulfate 325 (65 FE) MG tablet Take 325 mg by mouth every evening.     [provider]  ibuprofen (ADVIL,MOTRIN) 200 MG tablet Take 400 mg by mouth every 6 (six) hours as needed for fever or mild pain.    [provider]  oxyCODONE-acetaminophen (PERCOCET/ROXICET) 5-325 MG tablet Take 1-2 tablets by mouth every 4 (four) hours as needed for severe pain. 12/22/16   Lahoma Crocker, MD  vitamin C (ASCORBIC ACID) 500 MG tablet Take 500 mg by mouth every evening.     [provider]    Family History History reviewed. No pertinent family history.  Social History Social History  Substance Use Topics  . Smoking status: Never Smoker  . Smokeless tobacco: Never Used  . Alcohol use Yes     Comment: social     Allergies   Patient has no known allergies.   Review of Systems Review of Systems  Constitutional: Negative for  fever.  Respiratory: Negative for shortness of breath.   Cardiovascular: Negative for chest pain.  Gastrointestinal: Positive for diarrhea and nausea. Negative for abdominal pain and vomiting.  Musculoskeletal: Positive for back pain. Negative for gait problem and joint swelling.  All other systems reviewed and are negative.    Physical Exam Updated Vital Signs BP 138/83 (BP Location: Left Arm)   Pulse 75   Temp 98.7 F (37.1 C) (Oral)   Resp 16   Ht 5\' 5"  (1.651 m)   Wt 99.8 kg (220 lb)   LMP 11/11/2016   SpO2 98%   BMI 36.61 kg/m   Physical Exam  Constitutional: She is oriented to person, place, and time. She  appears well-developed and well-nourished. No distress.  HENT:  Head: Normocephalic and atraumatic.  Mouth/Throat: No oropharyngeal exudate.  Eyes: Pupils are equal, round, and reactive to light. Conjunctivae are normal.  Neck: Normal range of motion. Neck supple.  Cardiovascular: Normal rate, regular rhythm, normal heart sounds and intact distal pulses.   Pulmonary/Chest: Effort normal and breath sounds normal. She has no wheezes. She has no rales.  Abdominal: Soft. Bowel sounds are normal. She exhibits no mass. There is no tenderness. There is no rebound and no guarding.  Musculoskeletal: Normal range of motion.       Back:  Neurological: She is alert and oriented to person, place, and time. She displays normal reflexes.  Skin: Skin is warm and dry. Capillary refill takes less than 2 seconds.  Psychiatric: She has a normal mood and affect.     ED Treatments / Results   Vitals:   09/20/17 2329 09/21/17 0136  BP: 138/83 138/78  Pulse: 75 66  Resp: 16 18  Temp: 98.7 F (37.1 C)   SpO2: 98% 98%    Labs (all labs ordered are listed, but only abnormal results are displayed)  Results for orders placed or performed during the hospital encounter of 09/20/17  Urinalysis, Routine w reflex microscopic  Result Value Ref Range   Color, Urine YELLOW YELLOW   APPearance CLEAR CLEAR   Specific Gravity, Urine 1.020 1.005 - 1.030   pH 6.0 5.0 - 8.0   Glucose, UA NEGATIVE NEGATIVE mg/dL   Hgb urine dipstick NEGATIVE NEGATIVE   Bilirubin Urine NEGATIVE NEGATIVE   Ketones, ur NEGATIVE NEGATIVE mg/dL   Protein, ur NEGATIVE NEGATIVE mg/dL   Nitrite NEGATIVE NEGATIVE   Leukocytes, UA NEGATIVE NEGATIVE  Basic metabolic panel  Result Value Ref Range   Sodium 134 (L) 135 - 145 mmol/L   Potassium 3.9 3.5 - 5.1 mmol/L   Chloride 105 101 - 111 mmol/L   CO2 22 22 - 32 mmol/L   Glucose, Bld 115 (H) 65 - 99 mg/dL   BUN 29 (H) 6 - 20 mg/dL   Creatinine, Ser 1.67 (H) 0.44 - 1.00 mg/dL   Calcium  8.6 (L) 8.9 - 10.3 mg/dL   GFR calc non Af Amer 36 (L) >60 mL/min   GFR calc Af Amer 42 (L) >60 mL/min   Anion gap 7 5 - 15   Ct Renal Stone Study  Result Date: 09/21/2017 CLINICAL DATA:  Mid back pain for 4 days. EXAM: CT ABDOMEN AND PELVIS WITHOUT CONTRAST TECHNIQUE: Multidetector CT imaging of the abdomen and pelvis was performed following the standard protocol without IV contrast. COMPARISON:  CT 11/29/2016 FINDINGS: Lower chest: Unchanged calcified granuloma in the left lower lobe. No consolidation. No pleural fluid. Hepatobiliary: Low-density 15 mil lesion in the inferior right lobe  of the liver as not as well-defined on the current exam, but grossly stable in size. No evidence of new focal lesion. Mild hepatic steatosis. Gallbladder is decompressed. No calcified gallstone or biliary dilatation. Pancreas: No ductal dilatation or inflammation. Spleen: Normal in size without focal abnormality. Adrenals/Urinary Tract: Normal adrenal glands. Obstructing 9 mm stone in the left mid proximal ureter with moderate hydronephrosis. Mild left perinephric edema. Ureter distal to this stone is decompressed. No additional nonobstructing calculi in either kidney. No right hydronephrosis. Urinary bladder is decompressed. Stomach/Bowel: Stomach is within normal limits. Appendix appears normal. No evidence of bowel wall thickening, distention, or inflammatory changes. Vascular/Lymphatic: Normal caliber abdominal aorta. There prominent mesenteric nodes, largest measuring 7 mm short axis. These are stable from prior exam. No retroperitoneal or pelvic sidewall adenopathy. Reproductive: Interval hysterectomy.  No adnexal mass. Other: No free air, free fluid, or intra-abdominal fluid collection. Post laparotomy change of the abdominal wall. Musculoskeletal: There are no acute or suspicious osseous abnormalities. IMPRESSION: 1. Obstructing 9 mm stone in the left mid proximal ureter with moderate hydronephrosis. 2. Prominent  mesenteric nodes are similar to prior exam, size stability over 9 months favors benign etiology. 3. Hepatic steatosis. Electronically Signed   By: Jeb Levering M.D.   On: 09/21/2017 02:02    Radiology No results found.  Procedures Procedures (including critical care time)  Medications Ordered in ED Medications  sodium chloride 0.9 % bolus 1,000 mL (not administered)  dexamethasone (DECADRON) injection 4 mg (not administered)  ketorolac (TORADOL) 30 MG/ML injection 15 mg (not administered)    259 Case d/w Dr. Tresa Moore, call Monday for follow up.  If problems return to [facility] ED  Final Clinical Impressions(s) / ED Diagnoses  Kidney stone superimposed on diarrhea and muscle pain:  Strict return precautions given for pain, fevers, decreased urine, vomiting or any concerns.  Call Monday for FU.  Shortness of breath, swelling or the lips or tongue, chest pain, dyspnea on exertion, new weakness or numbness changes in vision or speech,  Inability to tolerate liquids or food, changes in voice cough, altered mental status or any concerns. No signs of systemic illness or infection. The patient is nontoxic-appearing on exam and vital signs are within normal limits.    I have reviewed the triage vital signs and the nursing notes. Pertinent labs &imaging results that were available during my care of the patient were reviewed by me and considered in my medical decision making (see chart for details).  After history, exam, and medical workup I feel the patient has been appropriately medically screened and is safe for discharge home. Pertinent diagnoses were discussed with the patient. Patient was given return precautions.          Tishie Altmann, MD 09/21/17 [phone]

## 2017-09-24 ENCOUNTER — Other Ambulatory Visit: Payer: Self-pay | Admitting: Urology

## 2017-09-24 NOTE — Discharge Instructions (Signed)
Lithotripsy, Care After °This sheet gives you information about how to care for yourself after your procedure. Your health care provider may also give you more specific instructions. If you have problems or questions, contact your health care provider. °What can I expect after the procedure? °After the procedure, it is common to have: °· Some blood in your urine. This should only last for a few days. °· Soreness in your back, sides, or upper abdomen for a few days. °· Blotches or bruises on your back where the pressure wave entered the skin. °· Pain, discomfort, or nausea when pieces (fragments) of the kidney stone move through the tube that carries urine from the kidney to the bladder (ureter). Stone fragments may pass soon after the procedure, but they may continue to pass for up to 4-8 weeks. °? If you have severe pain or nausea, contact your health care provider. This may be caused by a large stone that was not broken up, and this may mean that you need more treatment. °· Some pain or discomfort during urination. °· Some pain or discomfort in the lower abdomen or (in men) at the base of the penis. ° °Follow these instructions at home: °Medicines °· Take over-the-counter and prescription medicines only as told by your health care provider. °· If you were prescribed an antibiotic medicine, take it as told by your health care provider. Do not stop taking the antibiotic even if you start to feel better. °· Do not drive for 24 hours if you were given a medicine to help you relax (sedative). °· Do not drive or use heavy machinery while taking prescription pain medicine. °Eating and drinking °· Drink enough water and fluids to keep your urine clear or pale yellow. This helps any remaining pieces of the stone to pass. It can also help prevent new stones from forming. °· Eat plenty of fresh fruits and vegetables. °· Follow instructions from your health care provider about eating and drinking restrictions. You may be  instructed: °? To reduce how much salt (sodium) you eat or drink. Check ingredients and nutrition facts on packaged foods and beverages. °? To reduce how much meat you eat. °· Eat the recommended amount of calcium for your age and gender. Ask your health care provider how much calcium you should have. °General instructions °· Get plenty of rest. °· Most people can resume normal activities 1-2 days after the procedure. Ask your health care provider what activities are safe for you. °· If directed, strain all urine through the strainer that was provided by your health care provider. °? Keep all fragments for your health care provider to see. Any stones that are found may be sent to a medical lab for examination. The stone may be as small as a grain of salt. °· Keep all follow-up visits as told by your health care provider. This is important. °Contact a health care provider if: °· You have pain that is severe or does not get better with medicine. °· You have nausea that is severe or does not go away. °· You have blood in your urine longer than your health care provider told you to expect. °· You have more blood in your urine. °· You have pain during urination that does not go away. °· You urinate more frequently than usual and this does not go away. °· You develop a rash or any other possible signs of an allergic reaction. °Get help right away if: °· You have severe pain in   your back, sides, or upper abdomen. °· You have severe pain while urinating. °· Your urine is very dark red. °· You have blood in your stool (feces). °· You cannot pass any urine at all. °· You feel a strong urge to urinate after emptying your bladder. °· You have a fever or chills. °· You develop shortness of breath, difficulty breathing, or chest pain. °· You have severe nausea that leads to persistent vomiting. °· You faint. °Summary °· After this procedure, it is common to have some pain, discomfort, or nausea when pieces (fragments) of the  kidney stone move through the tube that carries urine from the kidney to the bladder (ureter). If this pain or nausea is severe, however, you should contact your health care provider. °· Most people can resume normal activities 1-2 days after the procedure. Ask your health care provider what activities are safe for you. °· Drink enough water and fluids to keep your urine clear or pale yellow. This helps any remaining pieces of the stone to pass, and it can help prevent new stones from forming. °· If directed, strain your urine and keep all fragments for your health care provider to see. Fragments or stones may be as small as a grain of salt. °· Get help right away if you have severe pain in your back, sides, or upper abdomen or have severe pain while urinating. °This information is not intended to replace advice given to you by your health care provider. Make sure you discuss any questions you have with your health care provider. °Document Released: 12/30/2007 Document Revised: 10/31/2016 Document Reviewed: 10/31/2016 °Elsevier Interactive Patient Education © 2017 [employer] ° °

## 2017-09-24 NOTE — H&P (Signed)
HPI: Diana Long is a 46 year-old female with a left ureteral stone.  The problem is on the left side. This is her first kidney stone. She is currently having flank pain and back pain. She denies having groin pain, nausea, vomiting, fever, and chills. She has not caught a stone in her urine strainer since her symptoms began.   She has never had surgical treatment for calculi in the past.   The patient began having pain about 1 week ago. She went to the emergency department on 09/21/2017. At that time she was having pain but no nausea, vomiting, fever. CT scan revealed a 9 mm proximal ureteral calculus. She was discharged in antimesenteric and Percocet that she has not needed any one of these. The pain has been tolerable.     ALLERGIES: None   MEDICATIONS: Levothyroxine Sodium 50 mcg tablet     GU PSH: None   NON-GU PSH: Hysterectomy - 12/05/2016    GU PMH: None   NON-GU PMH: Hypothyroidism    FAMILY HISTORY: Diabetes - Father Hypertension - Mother   SOCIAL HISTORY: Marital Status: Single Preferred Language: English; Race: White Current Smoking Status: Patient has never smoked.   Tobacco Use Assessment Completed: Used Tobacco in last 30 days? Drinks 1 caffeinated drink per day.    REVIEW OF SYSTEMS:    GU Review Female:   Patient denies frequent urination, hard to postpone urination, burning /pain with urination, get up at night to urinate, leakage of urine, stream starts and stops, trouble starting your stream, have to strain to urinate, and being pregnant.  Gastrointestinal (Upper):   Patient denies nausea, vomiting, and indigestion/ heartburn.  Gastrointestinal (Lower):   Patient denies diarrhea and constipation.  Constitutional:   Patient denies fever, night sweats, weight loss, and fatigue.  Skin:   Patient denies skin rash/ lesion and itching.  Eyes:   Patient denies blurred vision and double vision.  Ears/ Nose/ Throat:   Patient denies sore throat and sinus  problems.  Hematologic/Lymphatic:   Patient denies swollen glands and easy bruising.  Cardiovascular:   Patient denies leg swelling and chest pains.  Respiratory:   Patient denies cough and shortness of breath.  Endocrine:   Patient denies excessive thirst.  Musculoskeletal:   Patient denies back pain and joint pain.  Neurological:   Patient denies headaches and dizziness.  Psychologic:   Patient denies depression and anxiety.   VITAL SIGNS:      Weight 225 lb / 102.06 kg  Height 65 in / 165.1 cm  BP 137/89 mmHg  Temperature 98.7 F / 37.0 C  BMI 37.4 kg/m   MULTI-SYSTEM PHYSICAL EXAMINATION:    Constitutional: Well-nourished. No physical deformities. Normally developed. Good grooming.  Respiratory: No labored breathing, no use of accessory muscles.   Cardiovascular: Normal temperature, adequate perfusion  Skin: No paleness, no jaundice,  Neurologic / Psychiatric: Oriented to time, oriented to place, oriented to person. No depression, no anxiety, no agitation.  Gastrointestinal: No mass, no tenderness, no rigidity, non obese abdomen. No CVA tenderness  Eyes: Normal conjunctivae. Normal eyelids.  Musculoskeletal: Normal gait and station of head and neck.     PAST DATA REVIEWED:  Source Of History:  Patient  Records Review:   Previous Doctor Records  X-Ray Review: C.T. Abdomen/Pelvis: Reviewed Films. Reviewed Report. Discussed With Patient.     PROCEDURES:         KUB - [id number]  A single view of the abdomen is obtained.  KUB Study:  Abdominal Anterior-Posterior Radiograph  Date of Service: 09/23/2017  Indication: Left ureteral calculus  Bony Structures: Visualized bony structures of the ribs, spine, and pelvis demonstrate no obvious large lytic or blastic lesions.  Bowel Gas: Visualized bowel gas is non-obstructive appearing.   Rt Kidney: No radiodense structures are identified in the expected location of the kidney.  Marland Kitchen Lt Kidney: No radiodense structures are identified  in the expected location of the kidney. 1 cm radiopaque calcification overlying the area corresponding with the mid left ureter consistent with her history of left ureteral calculus  Bladder Area / Pelvis: No radiodense structures are identified in the expected location of the urinary bladder.   Multiple bilateral densities consistent with phleboliths are visualized.   IMPRESSION: 1. 1 cm radiopaque calcification overlying the area corresponding with the mid left ureter consistent with her history of left ureteral calculus               Urinalysis w/Scope Dipstick Dipstick Cont'd Micro  Color: Straw Bilirubin: Neg WBC/hpf: NS (Not Seen)  Appearance: Clear Ketones: Neg RBC/hpf: 0 - 2/hpf  Specific Gravity: 1.010 Blood: Trace Bacteria: Rare (0-9/hpf)  pH: 6.0 Protein: Neg Cystals: NS (Not Seen)  Glucose: Neg Urobilinogen: 0.2 Casts: NS (Not Seen)    Nitrites: Neg Trichomonas: Not Present    Leukocyte Esterase: Neg Mucous: Not Present      Epithelial Cells: 0 - 5/hpf      Yeast: NS (Not Seen)      Sperm: Not Present    ASSESSMENT:      ICD-10 Details  1 GU:   Ureteral calculus - N20.1    PLAN:          Notes:   We discussed the management of urinary stones. These options include observation, ureteroscopy, and shockwave lithotripsy. We discussed which options are relevant to these particular stones. We discussed the natural history of stones as well as the complications of untreated stones and the impact on quality of life without treatment as well as with each of the above listed treatments. We also discussed the efficacy of each treatment in its ability to clear the stone burden. With any of these management options I discussed the signs and symptoms of infection and the need for emergent treatment should these be experienced. For each option we discussed the ability of each procedure to clear the patient of their stone burden.   For observation I described the risks which include  but are not limited to silent renal damage, life-threatening infection, need for emergent surgery, failure to pass stone, and pain.   For ureteroscopy I described the risks which include heart attack, stroke, pulmonary embolus, death, bleeding, infection, damage to contiguous structures, positioning injury, ureteral stricture, ureteral avulsion, ureteral injury, need for ureteral stent, inability to perform ureteroscopy, need for an interval procedure, inability to clear stone burden, stent discomfort and pain.   For shockwave lithotripsy I described the risks which include arrhythmia, kidney contusion, kidney hemorrhage, need for transfusion, pain, inability to break up stone, inability to pass stone fragments, [place], infection associated with obstructing stones, need for different surgical procedure, need for repeat shockwave lithotripsy, and death.   The patient has elected to undergo left ESWL. She understands to call or go to the emergency room should she experience fever or severe pain refractory to by mouth medication, or inability to take anything by mouth.

## 2017-09-25 ENCOUNTER — Encounter (HOSPITAL_COMMUNITY): Payer: Self-pay | Admitting: General Practice

## 2017-09-26 ENCOUNTER — Ambulatory Visit (HOSPITAL_COMMUNITY)
Admission: RE | Admit: 2017-09-26 | Discharge: 2017-09-26 | Disposition: A | Discharge status: Home / Self Care | Payer: 59 | Source: Ambulatory Visit | Attending: Urology | Admitting: Urology

## 2017-09-26 ENCOUNTER — Ambulatory Visit (HOSPITAL_COMMUNITY): Payer: 59

## 2017-09-26 ENCOUNTER — Encounter (HOSPITAL_COMMUNITY): Payer: Self-pay | Admitting: Urology

## 2017-09-26 ENCOUNTER — Encounter (HOSPITAL_COMMUNITY)
Admission: RE | Disposition: A | Discharge status: Home / Self Care | Payer: Self-pay | Source: Ambulatory Visit | Attending: Urology

## 2017-09-26 DIAGNOSIS — Z79899 Other long term (current) drug therapy: Secondary | ICD-10-CM | POA: Insufficient documentation

## 2017-09-26 DIAGNOSIS — E039 Hypothyroidism, unspecified: Secondary | ICD-10-CM | POA: Insufficient documentation

## 2017-09-26 DIAGNOSIS — N201 Calculus of ureter: Secondary | ICD-10-CM | POA: Diagnosis present

## 2017-09-26 HISTORY — PX: EXTRACORPOREAL SHOCK WAVE LITHOTRIPSY: SHX1557

## 2017-09-26 HISTORY — DX: Hormone replacement therapy: Z79.890

## 2017-09-26 HISTORY — DX: Hypothyroidism, unspecified: E03.9

## 2017-09-26 HISTORY — DX: Personal history of urinary calculi: Z87.442

## 2017-09-26 SURGERY — LITHOTRIPSY, ESWL
Anesthesia: LOCAL | Laterality: Left

## 2017-09-26 MED ORDER — SODIUM CHLORIDE 0.9 % IV SOLN
INTRAVENOUS | Status: DC
  Administered 2017-09-26: 08:00:00 via INTRAVENOUS

## 2017-09-26 MED ORDER — TAMSULOSIN HCL 0.4 MG PO CAPS: 0.4000 mg | ORAL_CAPSULE | ORAL | 0 refills | Status: DC

## 2017-09-26 MED ORDER — DIAZEPAM 5 MG PO TABS
10.0000 mg | ORAL_TABLET | ORAL | Status: AC
  Administered 2017-09-26: 10 mg via ORAL
  Filled 2017-09-26: qty 2

## 2017-09-26 MED ORDER — OXYCODONE HCL 10 MG PO TABS
10.0000 mg | ORAL_TABLET | ORAL | 0 refills | Status: DC | PRN
Start: 2017-09-26 — End: 2020-05-18

## 2017-09-26 MED ORDER — DIPHENHYDRAMINE HCL 25 MG PO CAPS
25.0000 mg | ORAL_CAPSULE | ORAL | Status: AC
  Administered 2017-09-26: 25 mg via ORAL
  Filled 2017-09-26: qty 1

## 2017-09-26 MED ORDER — CIPROFLOXACIN HCL 500 MG PO TABS
500.0000 mg | ORAL_TABLET | ORAL | Status: AC
  Administered 2017-09-26: 500 mg via ORAL
  Filled 2017-09-26: qty 1

## 2017-09-26 NOTE — Op Note (Signed)
See [facility] OP note scanned into chart. Also because of the size, density, location and other factors that cannot be anticipated I feel this will likely be a staged procedure. This fact supersedes any indication in the scanned [facility] operative note to the contrary.  

## 2017-09-27 ENCOUNTER — Telehealth: Payer: Self-pay | Admitting: Gynecologic Oncology

## 2017-09-27 ENCOUNTER — Telehealth: Payer: Self-pay | Admitting: *Deleted

## 2017-09-27 NOTE — Telephone Encounter (Signed)
Returned call to patient.  She had left a message with concerns about her lymph nodes appearing prominent on CT.  Advised her that they are stable compared with her scan from December.  Advised I would address her concerns with Dr. Denman George and give her a call next week with her recommendations if any.

## 2017-09-27 NOTE — Telephone Encounter (Addendum)
Attempted to return the patient's call, unable to reach the patient [facility] to call the office back.  10:15am---patient called back and stated that "I had a CT scan on 9/29 for a kidney, the on call doctor stated that my lymph nodes are still enlarged, even though I have had surgery. They felt I needed to follow up with Dr. Denman George." "My question is if she can read the scan and just talk to me or do I need to come in for visit." Explained to the patient that this information will be given to [facility], Dr. Denman George is out of the office,  and we would call her back.

## 2017-10-02 ENCOUNTER — Telehealth: Payer: Self-pay | Admitting: Gynecologic Oncology

## 2017-10-02 NOTE — Telephone Encounter (Signed)
Returned call to patient and advised her that Dr. Denman George has reviewed her scans and is aware of her concerns about the prominent lymph nodes seen.  Since LNs are stable, no intervention necessary at this time.

## 2017-12-24 IMAGING — CR DG ABDOMEN 1V
2 series · 2 of 2 positions shown · non-contrast
Comparison: CT 09/21/2017, plain film 09/23/2017

CLINICAL DATA: 45-year-old female with a history of left ureteral
stone. Preop

EXAM:
ABDOMEN - 1 VIEW

[t abdomen supine *]
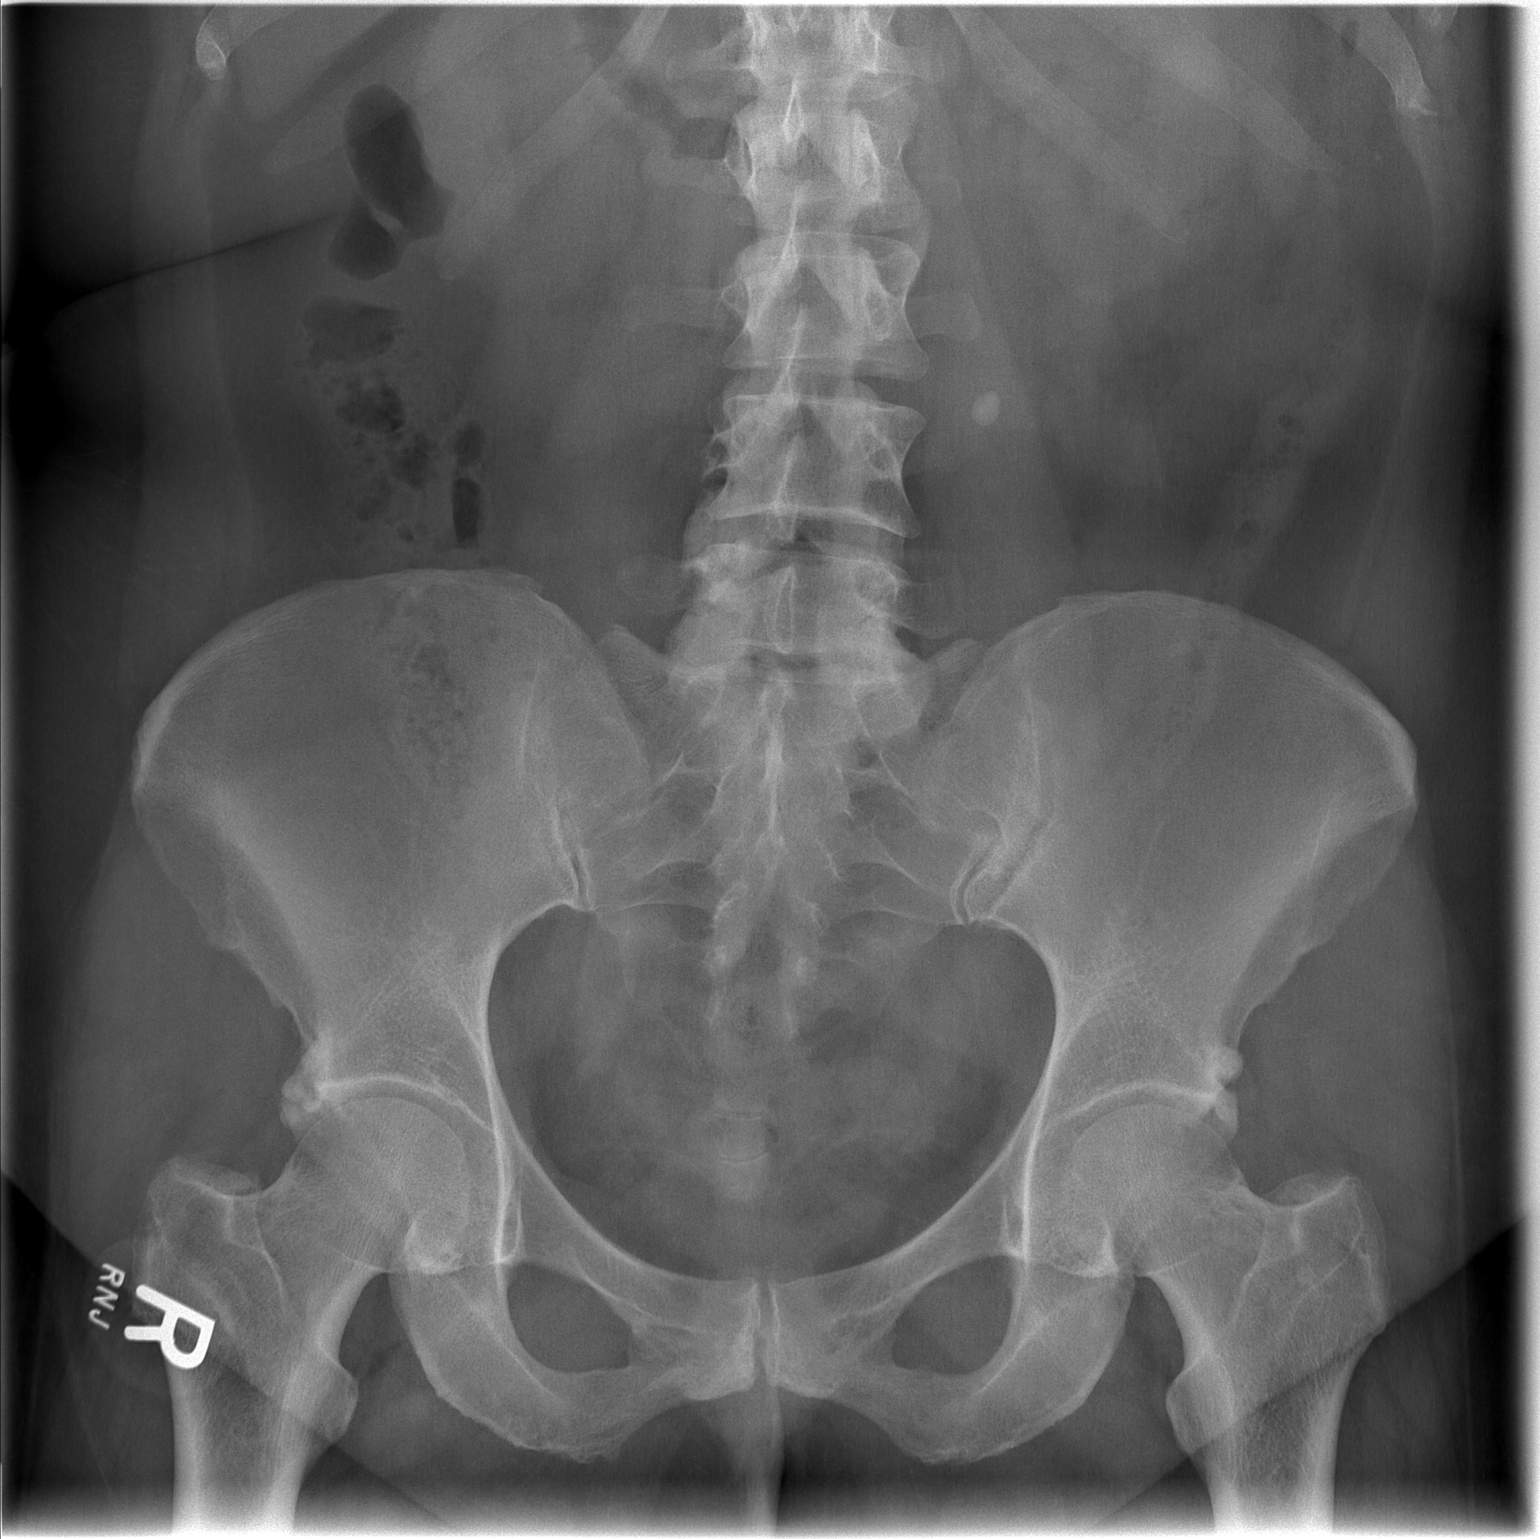

[t abdomen supine]
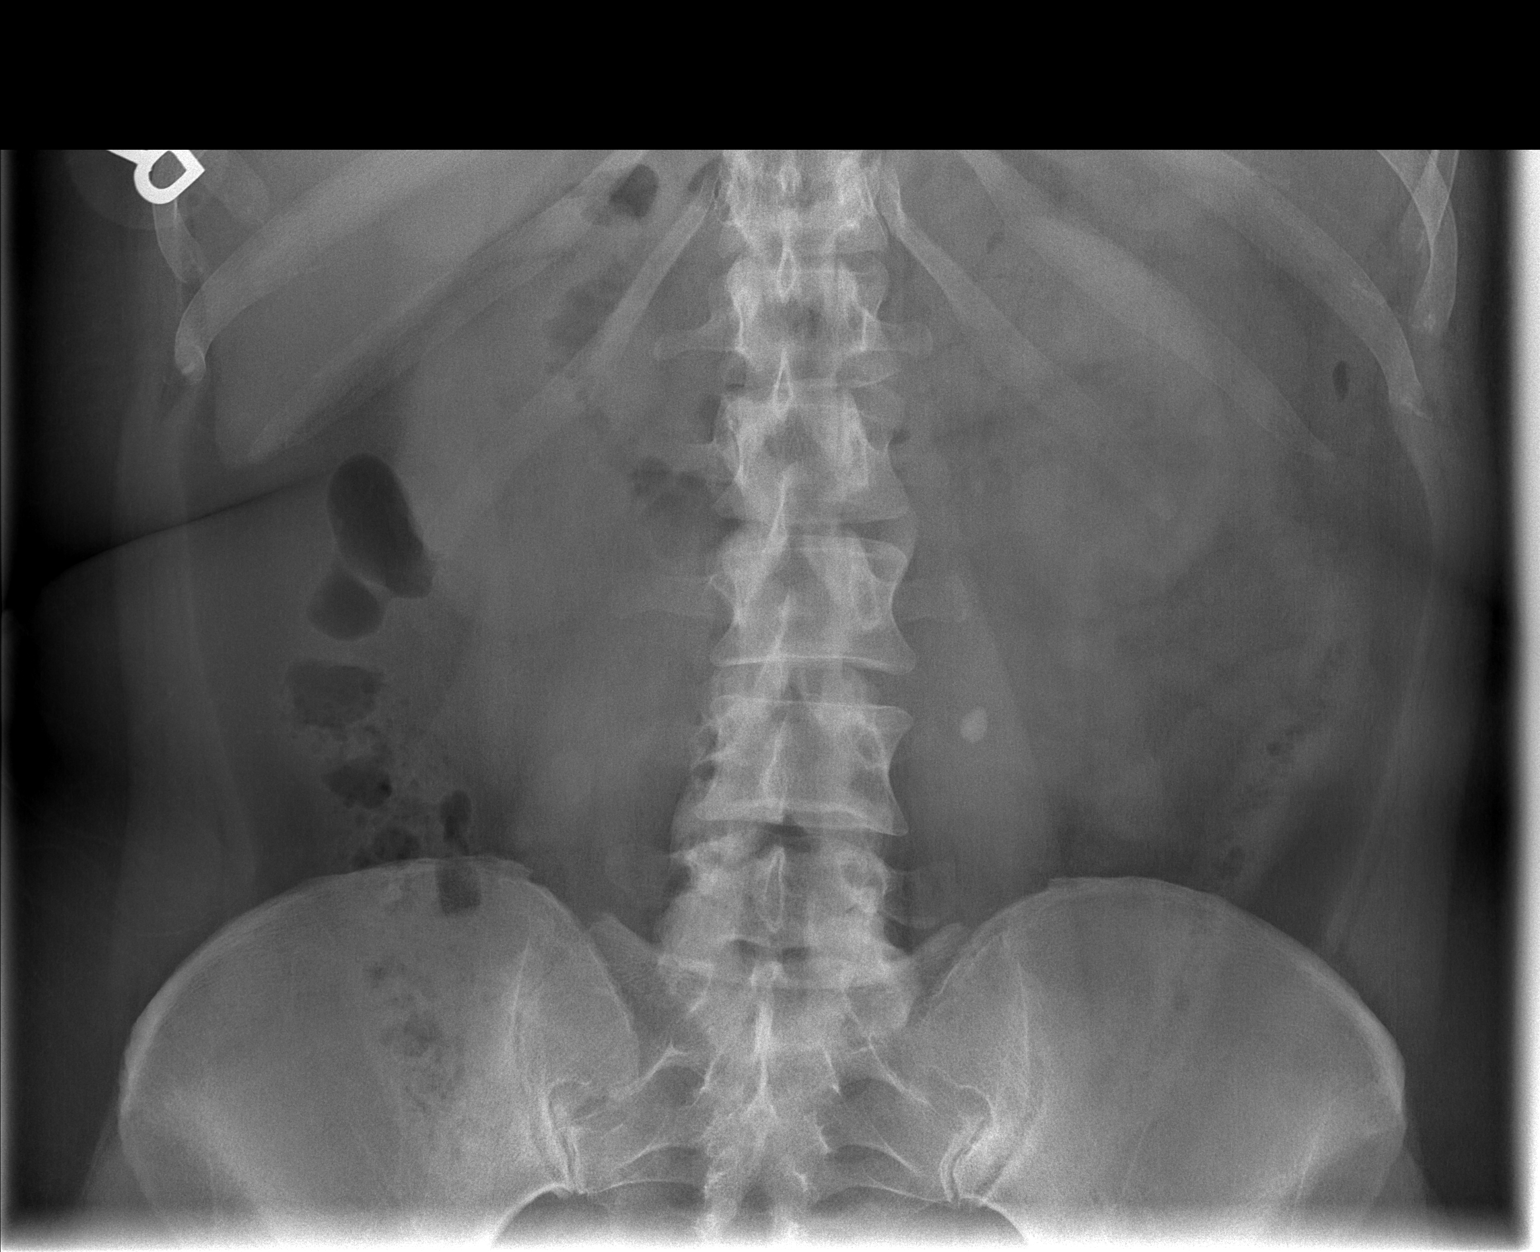

[2 of 2 positions shown; findings below may reference images not displayed]

FINDINGS: Unchanged position of the rounded radiopaque stone/ calcification of
the left abdomen measuring approximately 10 mm on the current plain
film. No progression along the ureter.

No other calcifications identified.

Unremarkable bony structures.

Unremarkable bowel gas pattern.
IMPRESSION: Re- demonstration of left ureteral stone which is unchanged in
position.

## 2019-10-05 ENCOUNTER — Other Ambulatory Visit: Payer: Self-pay

## 2019-10-05 DIAGNOSIS — Z20822 Contact with and (suspected) exposure to covid-19: Secondary | ICD-10-CM

## 2019-10-07 LAB — NOVEL CORONAVIRUS, NAA: SARS-CoV-2, NAA: NOT DETECTED

## 2019-12-02 ENCOUNTER — Other Ambulatory Visit: Payer: Self-pay

## 2019-12-02 DIAGNOSIS — Z20822 Contact with and (suspected) exposure to covid-19: Secondary | ICD-10-CM

## 2019-12-04 LAB — NOVEL CORONAVIRUS, NAA: SARS-CoV-2, NAA: NOT DETECTED

## 2020-03-03 ENCOUNTER — Ambulatory Visit: Payer: 59 | Attending: Internal Medicine

## 2020-03-03 DIAGNOSIS — Z23 Encounter for immunization: Secondary | ICD-10-CM

## 2020-03-03 NOTE — Progress Notes (Signed)
   Covid-19 Vaccination Clinic  Name:  Diana Long    MRN: [id number] DOB: 1971-12-20  03/03/2020  [name] was observed post Covid-19 immunization for 15 minutes without incident. She was provided with Vaccine Information Sheet and instruction to access the V-Safe system.   [name] was instructed to call 911 with any severe reactions post vaccine: Marland Kitchen Difficulty breathing  . Swelling of face and throat  . A fast heartbeat  . A bad rash all over body  . Dizziness and weakness   Immunizations Administered    Name Date Dose VIS Date Route   Pfizer COVID-19 Vaccine 03/03/2020 11:41 AM 0.3 mL 12/04/2019 Intramuscular   Manufacturer: [place]   Lot: [id number]   [place]: [id number]

## 2020-03-28 ENCOUNTER — Ambulatory Visit: Payer: 59 | Attending: Internal Medicine

## 2020-03-28 DIAGNOSIS — Z23 Encounter for immunization: Secondary | ICD-10-CM

## 2020-05-18 ENCOUNTER — Ambulatory Visit (INDEPENDENT_AMBULATORY_CARE_PROVIDER_SITE_OTHER): Payer: 59 | Admitting: Family Medicine

## 2020-05-18 ENCOUNTER — Other Ambulatory Visit: Payer: Self-pay

## 2020-05-18 ENCOUNTER — Encounter (INDEPENDENT_AMBULATORY_CARE_PROVIDER_SITE_OTHER): Payer: Self-pay | Admitting: Family Medicine

## 2020-05-18 VITALS — BP 122/77 | HR 83 | Temp 97.9°F | Ht 65.0 in | Wt 223.0 lb

## 2020-05-18 DIAGNOSIS — R5383 Other fatigue: Secondary | ICD-10-CM | POA: Diagnosis not present

## 2020-05-18 DIAGNOSIS — Z6837 Body mass index (BMI) 37.0-37.9, adult: Secondary | ICD-10-CM

## 2020-05-18 DIAGNOSIS — R0602 Shortness of breath: Secondary | ICD-10-CM

## 2020-05-18 DIAGNOSIS — Z9189 Other specified personal risk factors, not elsewhere classified: Secondary | ICD-10-CM | POA: Diagnosis not present

## 2020-05-18 DIAGNOSIS — E038 Other specified hypothyroidism: Secondary | ICD-10-CM | POA: Diagnosis not present

## 2020-05-18 DIAGNOSIS — Z0289 Encounter for other administrative examinations: Secondary | ICD-10-CM

## 2020-05-18 DIAGNOSIS — R7303 Prediabetes: Secondary | ICD-10-CM

## 2020-05-18 DIAGNOSIS — Z1331 Encounter for screening for depression: Secondary | ICD-10-CM

## 2020-05-18 DIAGNOSIS — E7849 Other hyperlipidemia: Secondary | ICD-10-CM | POA: Diagnosis not present

## 2020-05-18 NOTE — Progress Notes (Signed)
Dear Dr. Sandford Craze,   Thank you for referring Diana Long to our clinic. The following note includes my evaluation and treatment recommendations.  Chief Complaint:   OBESITY Diana Long (MR# [id number]) is a 49 y.o. female who presents for evaluation and treatment of obesity and related comorbidities. Current BMI is Body mass index is 37.11 kg/m. Diana Long has been struggling with her weight for many years and has been unsuccessful in either losing weight, maintaining weight loss, or reaching her healthy weight goal.  Diana Long is currently in the action stage of change and ready to dedicate time achieving and maintaining a healthier weight. Diana Long is interested in becoming our patient and working on intensive lifestyle modifications including (but not limited to) diet and exercise for weight loss.  Diana Long eats out for lunch and dinner on Saturday and lunch on Sunday.  She has [employer] yogurt with fruit (1 cup strawberries) and coffee with Natural Bliss creamer (1-2 tbsp) or Kind almond blueberry granola bar.  Lunch will consist of a [place] sandwich (2 slices) and mustard with fruit (feel full).  Around 5-6 pm, chips or piece of bread.  Dinner will be a Counsellor with black beans (1/2-1 cup) with tortilla chips (1/2 bag).  Diana Long's habits were reviewed today and are as follows: Her family eats meals together, she thinks her family will eat healthier with her, she has been heavy most of her life, she started gaining weight after puberty, her heaviest weight ever was 235 pounds, she craves carbs, she frequently eats larger portions than normal and she struggles with emotional eating.  Depression Screen Diana Long's Food and Mood (modified PHQ-9) score was 5.  Depression screen Diana Long 2/9 05/18/2020  Decreased Interest 0  Down, Depressed, Hopeless 1  PHQ - 2 Score 1  Altered sleeping 2  Tired, decreased energy 1  Change in appetite 1  Feeling bad or failure about  yourself  0  Trouble concentrating 0  Moving slowly or fidgety/restless 0  Suicidal thoughts 0  PHQ-9 Score 5  Difficult doing work/chores Not difficult at all   Subjective:   1. Other fatigue Diana Long denies daytime somnolence and admits to waking up still tired. Patent has a history of symptoms of snoring. Diana Long generally gets 6 or 7 hours of sleep per night, and states that she has poor sleep quality. Snoring is present. Apneic episodes are not present. Epworth Sleepiness Score is 0.  2. SOB (shortness of breath) on exertion Diana Long notes increasing shortness of breath with exercising and seems to be worsening over time with weight gain. She notes getting out of breath sooner with activity than she used to. This has not gotten worse recently. Diana Long denies shortness of breath at rest or orthopnea.  3. Other specified hypothyroidism She was not on any medication for 2 years.  She is now taking 100 mcg levothyroxine daily.  No heat or cold intolerance. No palpitations.  4. Other hyperlipidemia Diana Long has hyperlipidemia and has been trying to improve her cholesterol levels with intensive lifestyle modification including a low saturated fat diet, exercise and weight loss. She denies any chest pain, claudication or myalgias.  She has been on simvastatin for about 6 months.  Lab Results  Component Value Date   ALT 20 12/13/2016   AST 24 12/13/2016   ALKPHOS 70 12/13/2016   BILITOT 1.0 12/13/2016   5. Prediabetes Diana Long has a diagnosis of prediabetes based on her elevated HgA1c and was informed this puts her at  greater risk of developing diabetes. She continues to work on diet and exercise to decrease her risk of diabetes. She denies nausea or hypoglycemia.  She is not on any medications.  6. Depression screening Diana Long was screened for depression as part of her new patient workup.  PHQ-9 is 5.  7. At risk for diabetes mellitus Diana Long is at higher than average risk for developing  diabetes due to her obesity.   Assessment/Plan:   1. Other fatigue Diana Long does feel that her weight is causing her energy to be lower than it should be. Fatigue may be related to obesity, depression or many other causes. Labs will be ordered, and in the meanwhile, Diana Long will focus on self care including making healthy food choices, increasing physical activity and focusing on stress reduction. - EKG 12-Lead - Comprehensive metabolic panel - VITAMIN D 25 Hydroxy (Vit-D Deficiency, Fractures) - Folate - Vitamin B12  2. SOB (shortness of breath) on exertion Diana Long does not feel that she gets out of breath more easily that she used to when she exercises. Diana Long's shortness of breath appears to be obesity related and exercise induced. She has agreed to work on weight loss and gradually increase exercise to treat her exercise induced shortness of breath. Will continue to monitor closely. - Comprehensive metabolic panel - VITAMIN D 25 Hydroxy (Vit-D Deficiency, Fractures) - Folate - Vitamin B12  3. Other specified hypothyroidism Patient with long-standing hypothyroidism, on levothyroxine therapy. She appears euthyroid. Orders and follow up as documented in patient record.  Counseling . Good thyroid control is important for overall health. Supratherapeutic thyroid levels are dangerous and will not improve weight loss results. . The correct way to take levothyroxine is fasting, with water, separated by at least 30 minutes from breakfast, and separated by more than 4 hours from calcium, iron, multivitamins, acid reflux medications (PPIs).  - T3 - T4 - TSH  4. Other hyperlipidemia Cardiovascular risk and specific lipid/LDL goals reviewed.  We discussed several lifestyle modifications today and Diana Long will continue to work on diet, exercise and weight loss efforts. Orders and follow up as documented in patient record.   Counseling Intensive lifestyle modifications are the first line  treatment for this issue. . Dietary changes: Increase soluble fiber. Decrease simple carbohydrates. . Exercise changes: Moderate to vigorous-intensity aerobic activity 150 minutes per week if tolerated. . Lipid-lowering medications: see documented in medical record. - Comprehensive metabolic panel - Lipid Panel With LDL/HDL Ratio  5. Prediabetes Lauris will continue to work on weight loss, exercise, and decreasing simple carbohydrates to help decrease the risk of diabetes.  - Hemoglobin A1c - Insulin, random  6. Depression screening Depression screen was negative.  7. At risk for diabetes mellitus Rylei was given approximately 15 minutes of diabetes education and counseling today. We discussed intensive lifestyle modifications today with an emphasis on weight loss as well as increasing exercise and decreasing simple carbohydrates in her diet. We also reviewed medication options with an emphasis on risk versus benefit of those discussed.   Repetitive spaced learning was employed today to elicit superior memory formation and behavioral change.  8. Class 2 severe obesity with serious comorbidity and body mass index (BMI) of 37.0 to 37.9 in adult, unspecified obesity type (HCC) Lynnon is currently in the action stage of change and her goal is to continue with weight loss efforts. I recommend Tanaja begin the structured treatment plan as follows:  She has agreed to the Category 2 Plan +100 calories.  Exercise goals:  No exercise has been prescribed at this time.   Behavioral modification strategies: increasing lean protein intake, increasing vegetables, meal planning and cooking strategies, keeping healthy foods in the home and planning for success.  She was informed of the importance of frequent follow-up visits to maximize her success with intensive lifestyle modifications for her multiple health conditions. She was informed we would discuss her lab results at her next visit unless  there is a critical issue that needs to be addressed sooner. Tula agreed to keep her next visit at the agreed upon time to discuss these results.  Objective:   Blood pressure 122/77, pulse 83, temperature 97.9 F (36.6 C), temperature source Oral, height 5\' 5"  (1.651 m), weight 223 lb (101.2 kg), last menstrual period 11/11/2016, SpO2 100 %. Body mass index is 37.11 kg/m.  EKG: Normal sinus rhythm, rate 78 bpm.  Indirect Calorimeter completed today shows a VO2 of 235 and a REE of 1638.  Her calculated basal metabolic rate is 123456 thus her basal metabolic rate is worse than expected.  General: Cooperative, alert, well developed, in no acute distress. HEENT: Conjunctivae and lids unremarkable. Cardiovascular: Regular rhythm.  Lungs: Normal work of breathing. Neurologic: No focal deficits.   Lab Results  Component Value Date   CREATININE 1.67 (H) 09/21/2017   BUN 29 (H) 09/21/2017   NA 134 (L) 09/21/2017   K 3.9 09/21/2017   CL 105 09/21/2017   CO2 22 09/21/2017   Lab Results  Component Value Date   ALT 20 12/13/2016   AST 24 12/13/2016   ALKPHOS 70 12/13/2016   BILITOT 1.0 12/13/2016   Lab Results  Component Value Date   WBC 11.6 (H) 12/21/2016   HGB 11.8 (L) 12/21/2016   HCT 35.7 (L) 12/21/2016   MCV 80.0 12/21/2016   PLT 369 12/21/2016   Attestation Statements:   This is the patient's first visit at [facility]. The patient's NEW PATIENT PACKET was reviewed at length. Included in the packet: current and past health history, medications, allergies, ROS, gynecologic history (women only), surgical history, family history, social history, weight history, weight loss surgery history (for those that have had weight loss surgery), nutritional evaluation, mood and food questionnaire, PHQ9, Epworth questionnaire, sleep habits questionnaire, patient life and health improvement goals questionnaire. These will all be scanned into the patient's chart under media.    During the visit, I independently reviewed the patient's EKG, bioimpedance scale results, and indirect calorimeter results. I used this information to tailor a meal plan for the patient that will help her to lose weight and will improve her obesity-related conditions going forward. I performed a medically necessary appropriate examination and/or evaluation. I discussed the assessment and treatment plan with the patient. The patient was provided an opportunity to ask questions and all were answered. The patient agreed with the plan and demonstrated an understanding of the instructions. Labs were ordered at this visit and will be reviewed at the next visit unless more critical results need to be addressed immediately. Clinical information was updated and documented in the EMR.   Time spent on visit including pre-visit chart review and post-visit care was 45 minutes.   A separate 15 minutes was spent on risk counseling (see above).    I, Water quality scientist, CMA, am acting as transcriptionist for Coralie Common, MD.  I have reviewed the above documentation for accuracy and completeness, and I agree with the above. - Jinny Blossom, MD

## 2020-05-19 LAB — COMPREHENSIVE METABOLIC PANEL
ALT: 62 IU/L — ABNORMAL HIGH (ref 0–32)
AST: 38 IU/L (ref 0–40)
Albumin/Globulin Ratio: 1.7 (ref 1.2–2.2)
Albumin: 4.5 g/dL (ref 3.8–4.8)
Alkaline Phosphatase: 96 IU/L (ref 48–121)
BUN/Creatinine Ratio: 17 (ref 9–23)
BUN: 16 mg/dL (ref 6–24)
Bilirubin Total: 1 mg/dL (ref 0.0–1.2)
CO2: 22 mmol/L (ref 20–29)
Calcium: 9.7 mg/dL (ref 8.7–10.2)
Chloride: 101 mmol/L (ref 96–106)
Creatinine, Ser: 0.94 mg/dL (ref 0.57–1.00)
GFR calc Af Amer: 84 mL/min/{1.73_m2} (ref >59)
GFR calc non Af Amer: 72 mL/min/{1.73_m2} (ref >59)
Globulin, Total: 2.7 g/dL (ref 1.5–4.5)
Glucose: 96 mg/dL (ref 65–99)
Potassium: 4.9 mmol/L (ref 3.5–5.2)
Sodium: 140 mmol/L (ref 134–144)
Total Protein: 7.2 g/dL (ref 6.0–8.5)

## 2020-05-19 LAB — FOLATE: Folate: 10.8 ng/mL (ref >3.0)

## 2020-05-19 LAB — LIPID PANEL WITH LDL/HDL RATIO
Cholesterol, Total: 181 mg/dL (ref 100–199)
HDL: 41 mg/dL (ref >39)
LDL Chol Calc (NIH): 117 mg/dL — ABNORMAL HIGH (ref 0–99)
LDL/HDL Ratio: 2.9 ratio (ref 0.0–3.2)
Triglycerides: 125 mg/dL (ref 0–149)
VLDL Cholesterol Cal: 23 mg/dL (ref 5–40)

## 2020-05-19 LAB — VITAMIN B12: Vitamin B-12: 509 pg/mL (ref 232–1245)

## 2020-05-19 LAB — HEMOGLOBIN A1C
Est. average glucose Bld gHb Est-mCnc: 120 mg/dL
Hgb A1c MFr Bld: 5.8 % — ABNORMAL HIGH (ref 4.8–5.6)

## 2020-05-19 LAB — INSULIN, RANDOM: INSULIN: 15.8 u[IU]/mL (ref 2.6–24.9)

## 2020-05-19 LAB — T3: T3, Total: 132 ng/dL (ref 71–180)

## 2020-05-19 LAB — TSH: TSH: 1.45 u[IU]/mL (ref 0.450–4.500)

## 2020-05-19 LAB — T4: T4, Total: 10.1 ug/dL (ref 4.5–12.0)

## 2020-05-19 LAB — VITAMIN D 25 HYDROXY (VIT D DEFICIENCY, FRACTURES): Vit D, 25-Hydroxy: 29.2 ng/mL — ABNORMAL LOW (ref 30.0–100.0)

## 2020-06-01 ENCOUNTER — Ambulatory Visit (INDEPENDENT_AMBULATORY_CARE_PROVIDER_SITE_OTHER): Payer: 59 | Admitting: Family Medicine

## 2020-06-01 ENCOUNTER — Other Ambulatory Visit: Payer: Self-pay

## 2020-06-01 ENCOUNTER — Encounter (INDEPENDENT_AMBULATORY_CARE_PROVIDER_SITE_OTHER): Payer: Self-pay | Admitting: Family Medicine

## 2020-06-01 VITALS — BP 124/88 | HR 106 | Temp 98.0°F | Ht 65.0 in | Wt 222.0 lb

## 2020-06-01 DIAGNOSIS — R7303 Prediabetes: Secondary | ICD-10-CM | POA: Diagnosis not present

## 2020-06-01 DIAGNOSIS — E7849 Other hyperlipidemia: Secondary | ICD-10-CM

## 2020-06-01 DIAGNOSIS — E559 Vitamin D deficiency, unspecified: Secondary | ICD-10-CM | POA: Diagnosis not present

## 2020-06-01 DIAGNOSIS — E038 Other specified hypothyroidism: Secondary | ICD-10-CM | POA: Diagnosis not present

## 2020-06-01 DIAGNOSIS — Z6837 Body mass index (BMI) 37.0-37.9, adult: Secondary | ICD-10-CM

## 2020-06-01 DIAGNOSIS — Z9189 Other specified personal risk factors, not elsewhere classified: Secondary | ICD-10-CM

## 2020-06-01 MED ORDER — VITAMIN D (ERGOCALCIFEROL) 1.25 MG (50000 UNIT) PO CAPS: 50000.0000 [IU] | ORAL_CAPSULE | ORAL | 0 refills | Status: DC

## 2020-06-01 MED ORDER — SIMVASTATIN 20 MG PO TABS: 20.0000 mg | ORAL_TABLET | Freq: Every day | ORAL | 0 refills | Status: AC

## 2020-06-01 NOTE — Progress Notes (Signed)
Chief Complaint:   OBESITY Diana Long is here to discuss her progress with her obesity treatment plan along with follow-up of her obesity related diagnoses. Diana Long is on the Category 2 Plan and states she is following her eating plan approximately 85% of the time. Diana Long states she is exercising for 0 minutes 0 times per week.  Today's visit was #: 2 Starting weight: 223 lbs Starting date: 05/18/2020 Today's weight: 222 lbs Today's date: 06/01/2020 Total lbs lost to date: 1 lb Total lbs lost since last in-office visit: 1 lb  Interim History: Diana Long reports that breakfast and lunch were easy on plan but dinner was difficult due to not preplanning, so she ended up eating off plan.  She is using coffee creamer, Babybell, and Yasso bars for her snack calories (was using all 300 calories).  She is looking for some dinner ideas.  Subjective:   1. Vitamin D deficiency Diana Long's Vitamin D level was 29.2 on 05/18/2020. She is currently taking OTC vitamin D 2000 IU each day. She denies nausea, vomiting or muscle weakness.  She endorses fatigue.  2. Prediabetes Diana Long has a diagnosis of prediabetes based on her elevated HgA1c and was informed this puts her at greater risk of developing diabetes. She continues to work on diet and exercise to decrease her risk of diabetes. She denies nausea or hypoglycemia.  She endorses carb cravings for rice and potatoes.  Lab Results  Component Value Date   HGBA1C 5.8 (H) 05/18/2020   Lab Results  Component Value Date   INSULIN 15.8 05/18/2020   3. Other specified hypothyroidism Diana Long is taking levothyroxine 100 mcg daily.  Lab Results  Component Value Date   TSH 1.450 05/18/2020   4. Other hyperlipidemia Diana Long has hyperlipidemia and has been trying to improve her cholesterol levels with intensive lifestyle modification including a low saturated fat diet, exercise and weight loss. She denies any chest pain, claudication or myalgias.  She takes  Zocor 20 mg daily.  Lab Results  Component Value Date   ALT 62 (H) 05/18/2020   AST 38 05/18/2020   ALKPHOS 96 05/18/2020   BILITOT 1.0 05/18/2020   Lab Results  Component Value Date   CHOL 181 05/18/2020   HDL 41 05/18/2020   LDLCALC 117 (H) 05/18/2020   TRIG 125 05/18/2020   5. At risk for diabetes mellitus Diana Long is at higher than average risk for developing diabetes due to her obesity.   Assessment/Plan:   1. Vitamin D deficiency Low Vitamin D level contributes to fatigue and are associated with obesity, breast, and colon cancer. She agrees to start to take prescription Vitamin D @50 ,000 IU every week and will follow-up for routine testing of Vitamin D, at least 2-3 times per year to avoid over-replacement. - Vitamin D, Ergocalciferol, (DRISDOL) 1.25 MG (50000 UNIT) CAPS capsule; Take 1 capsule (50,000 Units total) by mouth every 7 (seven) days.  Dispense: 4 capsule; Refill: 0  2. Prediabetes Diana Long will continue to work on weight loss, exercise, and decreasing simple carbohydrates to help decrease the risk of diabetes.  Follow-up labs in 3 months.  Defer metformin at this time.  3. Other specified hypothyroidism Patient with long-standing hypothyroidism, on levothyroxine therapy. She appears euthyroid. Orders and follow up as documented in patient record.  Follow-up labs in 3 months.  Counseling . Good thyroid control is important for overall health. Supratherapeutic thyroid levels are dangerous and will not improve weight loss results. . The correct way to take levothyroxine  is fasting, with water, separated by at least 30 minutes from breakfast, and separated by more than 4 hours from calcium, iron, multivitamins, acid reflux medications (PPIs).   4. Other hyperlipidemia Cardiovascular risk and specific lipid/LDL goals reviewed.  We discussed several lifestyle modifications today and Diana Long will continue to work on diet, exercise and weight loss efforts. Orders and  follow up as documented in patient record.  Follow-up labs in 3 months.   Counseling Intensive lifestyle modifications are the first line treatment for this issue. . Dietary changes: Increase soluble fiber. Decrease simple carbohydrates. . Exercise changes: Moderate to vigorous-intensity aerobic activity 150 minutes per week if tolerated. . Lipid-lowering medications: see documented in medical record. - simvastatin (ZOCOR) 20 MG tablet; Take 1 tablet (20 mg total) by mouth daily at 6 PM.  Dispense: 30 tablet; Refill: 0  5. At risk for diabetes mellitus Diana Long was given approximately 15 minutes of diabetes education and counseling today. We discussed intensive lifestyle modifications today with an emphasis on weight loss as well as increasing exercise and decreasing simple carbohydrates in her diet. We also reviewed medication options with an emphasis on risk versus benefit of those discussed.   Repetitive spaced learning was employed today to elicit superior memory formation and behavioral change.  6. Class 2 severe obesity with serious comorbidity and body mass index (BMI) of 37.0 to 37.9 in adult, unspecified obesity type (HCC) Diana Long is currently in the action stage of change. As such, her goal is to continue with weight loss efforts. She has agreed to the Category 2 Plan +100 calories.   Exercise goals: No exercise has been prescribed at this time.  Behavioral modification strategies: increasing lean protein intake, meal planning and cooking strategies, keeping healthy foods in the home and planning for success.  Diana Long has agreed to follow-up with our clinic in 2 weeks. She was informed of the importance of frequent follow-up visits to maximize her success with intensive lifestyle modifications for her multiple health conditions.   Objective:   Blood pressure 124/88, pulse (!) 106, temperature 98 F (36.7 C), temperature source Oral, height 5\' 5"  (1.651 m), weight 222 lb (100.7 kg),  last menstrual period 11/11/2016, SpO2 99 %. Body mass index is 36.94 kg/m.  General: Cooperative, alert, well developed, in no acute distress. HEENT: Conjunctivae and lids unremarkable. Cardiovascular: Regular rhythm.  Lungs: Normal work of breathing. Neurologic: No focal deficits.   Lab Results  Component Value Date   CREATININE 0.94 05/18/2020   BUN 16 05/18/2020   NA 140 05/18/2020   K 4.9 05/18/2020   CL 101 05/18/2020   CO2 22 05/18/2020   Lab Results  Component Value Date   ALT 62 (H) 05/18/2020   AST 38 05/18/2020   ALKPHOS 96 05/18/2020   BILITOT 1.0 05/18/2020   Lab Results  Component Value Date   HGBA1C 5.8 (H) 05/18/2020   Lab Results  Component Value Date   INSULIN 15.8 05/18/2020   Lab Results  Component Value Date   TSH 1.450 05/18/2020   Lab Results  Component Value Date   CHOL 181 05/18/2020   HDL 41 05/18/2020   LDLCALC 117 (H) 05/18/2020   TRIG 125 05/18/2020   Lab Results  Component Value Date   WBC 11.6 (H) 12/21/2016   HGB 11.8 (L) 12/21/2016   HCT 35.7 (L) 12/21/2016   MCV 80.0 12/21/2016   PLT 369 12/21/2016   Attestation Statements:   Reviewed by clinician on day of visit: allergies, medications,  problem list, medical history, surgical history, family history, social history, and previous encounter notes.  I, Water quality scientist, CMA, am acting as transcriptionist for Coralie Common, MD.  I have reviewed the above documentation for accuracy and completeness, and I agree with the above. - Jinny Blossom, MD

## 2020-06-15 ENCOUNTER — Encounter (INDEPENDENT_AMBULATORY_CARE_PROVIDER_SITE_OTHER): Payer: Self-pay | Admitting: Adult Health

## 2020-06-15 ENCOUNTER — Other Ambulatory Visit: Payer: Self-pay

## 2020-06-15 ENCOUNTER — Ambulatory Visit (INDEPENDENT_AMBULATORY_CARE_PROVIDER_SITE_OTHER): Payer: 59 | Admitting: Adult Health

## 2020-06-15 VITALS — BP 107/77 | HR 81 | Temp 97.9°F | Ht 65.0 in | Wt 218.0 lb

## 2020-06-15 DIAGNOSIS — Z6837 Body mass index (BMI) 37.0-37.9, adult: Secondary | ICD-10-CM | POA: Insufficient documentation

## 2020-06-15 DIAGNOSIS — E559 Vitamin D deficiency, unspecified: Secondary | ICD-10-CM

## 2020-06-15 DIAGNOSIS — R7303 Prediabetes: Secondary | ICD-10-CM | POA: Insufficient documentation

## 2020-06-15 DIAGNOSIS — Z6836 Body mass index (BMI) 36.0-36.9, adult: Secondary | ICD-10-CM

## 2020-06-15 NOTE — Progress Notes (Signed)
Chief Complaint:   OBESITY ANTARA BRECHEISEN is here to discuss her progress with her obesity treatment plan along with follow-up of her obesity related diagnoses. Teagyn is on the Category 2 Plan and states she is following her eating plan approximately 85% of the time. Jahna states she is exercising 0 minutes 0 times per week.  Today's visit was #: 3 Starting weight: 223 lbs Starting date: 05/18/2020 Today's weight: 218 lbs Today's date: 06/15/2020 Total lbs lost to date: 5 Total lbs lost since last in-office visit: 4  Interim History: Ashlynne has been able to follow the Category 2 meal plan for breakfast and lunch fairly easy, however, she feels that it is harder to stay on plan at dinner. She reports increased energy levels since starting ergocalciferol.  Subjective:   Vitamin D deficiency. Last Vitamin D was 29.2 on 05/18/2020. Kinzly is tolerating ergocalciferol well and reports increased energy since starting it.  Prediabetes. Rojean has a diagnosis of prediabetes based on her elevated HgA1c and was informed this puts her at greater risk of developing diabetes. She continues to work on diet and exercise to decrease her risk of diabetes. She denies nausea or hypoglycemia. Asuncion is not on metformin. She does report a decrease in polyphagia since starting the program.  Lab Results  Component Value Date   HGBA1C 5.8 (H) 05/18/2020   Lab Results  Component Value Date   INSULIN 15.8 05/18/2020   Assessment/Plan:   Vitamin D deficiency. Low Vitamin D level contributes to fatigue and are associated with obesity, breast, and colon cancer. She agrees to continue to take prescription Vitamin D as directed (no need for refill today) and will follow-up for routine testing of Vitamin D every 2-3 months.  Prediabetes. Dallana will continue to work on weight loss, exercise, and decreasing simple carbohydrates to help decrease the risk of diabetes. She will continue the Category  2 meal plan and will have labs checked every 3 months.  Class 2 severe obesity with serious comorbidity and body mass index (BMI) of 36.0 to 36.9 in adult, unspecified obesity type ([place]).  Mikia is currently in the action stage of change. As such, her goal is to continue with weight loss efforts. She has agreed to the Category 2 Plan and will journal 400-500 calories and 35+ grams of protein at supper.   Handout was provided on Recipe Guide.  Exercise goals: No exercise has been prescribed at this time.  Behavioral modification strategies: increasing lean protein intake, increasing water intake, meal planning and cooking strategies and planning for success.  Alba has agreed to follow-up with our clinic in 2 weeks. She was informed of the importance of frequent follow-up visits to maximize her success with intensive lifestyle modifications for her multiple health conditions.   Objective:   Blood pressure 107/77, pulse 81, temperature 97.9 F (36.6 C), temperature source Oral, height 5\' 5"  (1.651 m), weight 218 lb (98.9 kg), last menstrual period 11/11/2016, SpO2 98 %. Body mass index is 36.28 kg/m.  General: Cooperative, alert, well developed, in no acute distress. HEENT: Conjunctivae and lids unremarkable. Cardiovascular: Regular rhythm.  Lungs: Normal work of breathing. Neurologic: No focal deficits.   Lab Results  Component Value Date   CREATININE 0.94 05/18/2020   BUN 16 05/18/2020   NA 140 05/18/2020   K 4.9 05/18/2020   CL 101 05/18/2020   CO2 22 05/18/2020   Lab Results  Component Value Date   ALT 62 (H) 05/18/2020  AST 38 05/18/2020   ALKPHOS 96 05/18/2020   BILITOT 1.0 05/18/2020   Lab Results  Component Value Date   HGBA1C 5.8 (H) 05/18/2020   Lab Results  Component Value Date   INSULIN 15.8 05/18/2020   Lab Results  Component Value Date   TSH 1.450 05/18/2020   Lab Results  Component Value Date   CHOL 181 05/18/2020   HDL 41 05/18/2020    LDLCALC 117 (H) 05/18/2020   TRIG 125 05/18/2020   Lab Results  Component Value Date   WBC 11.6 (H) 12/21/2016   HGB 11.8 (L) 12/21/2016   HCT 35.7 (L) 12/21/2016   MCV 80.0 12/21/2016   PLT 369 12/21/2016   No results found for: IRON, TIBC, FERRITIN  Attestation Statements:   Reviewed by clinician on day of visit: allergies, medications, problem list, medical history, surgical history, family history, social history, and previous encounter notes.  Time spent on visit including pre-visit chart review and post-visit charting and care was 22 minutes.   I, Michaelene Song, am acting as Location manager for [employer], NP-C   I have reviewed the above documentation for accuracy and completeness, and I agree with the above. -  Esaw Grandchild, NP

## 2020-06-29 ENCOUNTER — Other Ambulatory Visit: Payer: Self-pay

## 2020-06-29 ENCOUNTER — Ambulatory Visit (INDEPENDENT_AMBULATORY_CARE_PROVIDER_SITE_OTHER): Payer: 59 | Admitting: Adult Health

## 2020-06-29 ENCOUNTER — Encounter (INDEPENDENT_AMBULATORY_CARE_PROVIDER_SITE_OTHER): Payer: Self-pay | Admitting: Adult Health

## 2020-06-29 VITALS — BP 112/78 | HR 95 | Temp 98.4°F | Ht 65.0 in | Wt 220.0 lb

## 2020-06-29 DIAGNOSIS — E559 Vitamin D deficiency, unspecified: Secondary | ICD-10-CM

## 2020-06-29 DIAGNOSIS — Z9189 Other specified personal risk factors, not elsewhere classified: Secondary | ICD-10-CM

## 2020-06-29 DIAGNOSIS — Z6836 Body mass index (BMI) 36.0-36.9, adult: Secondary | ICD-10-CM

## 2020-06-29 DIAGNOSIS — R7303 Prediabetes: Secondary | ICD-10-CM | POA: Diagnosis not present

## 2020-06-29 MED ORDER — VITAMIN D (ERGOCALCIFEROL) 1.25 MG (50000 UNIT) PO CAPS: 50000.0000 [IU] | ORAL_CAPSULE | ORAL | 0 refills | Status: DC

## 2020-06-29 NOTE — Progress Notes (Signed)
Chief Complaint:   OBESITY Diana Long is here to discuss her progress with her obesity treatment plan along with follow-up of her obesity related diagnoses. [name] is on the Category 2 Plan and states she is following her eating plan approximately 50% of the time. [name] states she is going to the gym 45 minutes 2 times per week.  Today's visit was #: 4 Starting weight: 223 lbs Starting date: 05/18/2020 Today's weight: 220 lbs Today's date: 06/29/2020 Total lbs lost to date: 3 Total lbs lost since last in-office visit: 0  Interim History: Diana Long has been able to consistently follow the plan for breakfast and lunch. She continues to be challenged with staying on track at dinner since she is cooking for herself and other family members.  She denies polyphagia or excessive cravings.  Subjective:   Vitamin D deficiency. [name] is on prescription strength Vitamin D supplementation and is tolerating it well. No nausea, vomiting, or muscle weakness. She has stopped OTC Vitamin D3 supplementation.   Ref. Range 05/18/2020 11:33  Vitamin D, 25-Hydroxy Latest Ref Range: 30.0 - 100.0 ng/mL 29.2 (L)   Prediabetes. Diana Long has a diagnosis of prediabetes based on her elevated HgA1c and was informed this puts her at greater risk of developing diabetes. She continues to work on diet and exercise to decrease her risk of diabetes. She denies nausea or hypoglycemia. Diana Long is not on metformin and denies polyphagia. She has a family history of polyphagia in her father and paternal grandfather.  Lab Results  Component Value Date   HGBA1C 5.8 (H) 05/18/2020   Lab Results  Component Value Date   INSULIN 15.8 05/18/2020   At risk for diabetes mellitus. Diana Long is at higher than average risk for developing diabetes due to prediabetes and obesity.   Assessment/Plan:   Vitamin D deficiency. Low Vitamin D level contributes to fatigue and are associated with obesity, breast, and colon cancer.  She was given a refill on her Vitamin D, Ergocalciferol, (DRISDOL) 1.25 MG (50000 UNIT) CAPS capsule every week #4 with 0 refills and will follow-up for routine testing of Vitamin D every 3 months.   Prediabetes. [name] will continue to work on weight loss, exercise, and decreasing simple carbohydrates to help decrease the risk of diabetes. She will continue to follow the Category 2 meal plan and will have labs checked every 3 months.  At risk for diabetes mellitus. Diana Long was given approximately 15 minutes of diabetes education and counseling today. We discussed intensive lifestyle modifications today with an emphasis on weight loss as well as increasing exercise and decreasing simple carbohydrates in her diet. We also reviewed medication options with an emphasis on risk versus benefit of those discussed.   Repetitive spaced learning was employed today to elicit superior memory formation and behavioral change.  Class 2 severe obesity with serious comorbidity and body mass index (BMI) of 36.0 to 36.9 in adult, unspecified obesity type ([place]).  Diana Long is currently in the action stage of change. As such, her goal is to continue with weight loss efforts. She has agreed to the Category 2 Plan.   Handout was provided on High Protein/Low Calorie Foods. She will focus on at least consuming 6-8 oz protein at dinnertime.  Exercise goals: Diana Long will continue her current exercise regimen.   Behavioral modification strategies: increasing lean protein intake, increasing water intake, meal planning and cooking strategies and planning for success.  Diana Long has agreed to follow-up with our clinic in 2 weeks. She  was informed of the importance of frequent follow-up visits to maximize her success with intensive lifestyle modifications for her multiple health conditions.   Objective:   Blood pressure 112/78, pulse 95, temperature 98.4 F (36.9 C), temperature source Oral, height 5\' 5"  (1.651 m), weight 220  lb (99.8 kg), last menstrual period 11/11/2016, SpO2 99 %. Body mass index is 36.61 kg/m.  General: Cooperative, alert, well developed, in no acute distress. HEENT: Conjunctivae and lids unremarkable. Cardiovascular: Regular rhythm.  Lungs: Normal work of breathing. Neurologic: No focal deficits.   Lab Results  Component Value Date   CREATININE 0.94 05/18/2020   BUN 16 05/18/2020   NA 140 05/18/2020   K 4.9 05/18/2020   CL 101 05/18/2020   CO2 22 05/18/2020   Lab Results  Component Value Date   ALT 62 (H) 05/18/2020   AST 38 05/18/2020   ALKPHOS 96 05/18/2020   BILITOT 1.0 05/18/2020   Lab Results  Component Value Date   HGBA1C 5.8 (H) 05/18/2020   Lab Results  Component Value Date   INSULIN 15.8 05/18/2020   Lab Results  Component Value Date   TSH 1.450 05/18/2020   Lab Results  Component Value Date   CHOL 181 05/18/2020   HDL 41 05/18/2020   LDLCALC 117 (H) 05/18/2020   TRIG 125 05/18/2020   Lab Results  Component Value Date   WBC 11.6 (H) 12/21/2016   HGB 11.8 (L) 12/21/2016   HCT 35.7 (L) 12/21/2016   MCV 80.0 12/21/2016   PLT 369 12/21/2016   No results found for: IRON, TIBC, FERRITIN  Attestation Statements:   Reviewed by clinician on day of visit: allergies, medications, problem list, medical history, surgical history, family history, social history, and previous encounter notes.  I, Michaelene Song, am acting as Location manager for [employer], NP-C   I have reviewed the above documentation for accuracy and completeness, and I agree with the above. -  Diana Grandchild, NP

## 2020-07-12 ENCOUNTER — Other Ambulatory Visit: Payer: Self-pay

## 2020-07-12 ENCOUNTER — Encounter (INDEPENDENT_AMBULATORY_CARE_PROVIDER_SITE_OTHER): Payer: Self-pay | Admitting: Adult Health

## 2020-07-12 ENCOUNTER — Ambulatory Visit (INDEPENDENT_AMBULATORY_CARE_PROVIDER_SITE_OTHER): Payer: 59 | Admitting: Adult Health

## 2020-07-12 VITALS — BP 106/70 | HR 74 | Temp 98.1°F | Ht 65.0 in | Wt 221.0 lb

## 2020-07-12 DIAGNOSIS — E038 Other specified hypothyroidism: Secondary | ICD-10-CM | POA: Diagnosis not present

## 2020-07-12 DIAGNOSIS — Z6836 Body mass index (BMI) 36.0-36.9, adult: Secondary | ICD-10-CM | POA: Diagnosis not present

## 2020-07-12 DIAGNOSIS — E559 Vitamin D deficiency, unspecified: Secondary | ICD-10-CM | POA: Diagnosis not present

## 2020-07-12 NOTE — Progress Notes (Signed)
Chief Complaint:   OBESITY Diana Long is here to discuss her progress with her obesity treatment plan along with follow-up of her obesity related diagnoses. Diana Long is on the Category 2 Plan and states she is following her eating plan approximately 70% of the time. Diana Long states she is walking 45 minutes 2 times per week.  Today's visit was #: 5 Starting weight: 223 lbs Starting date: 05/18/2020 Today's weight: 221 lbs Today's date: 07/12/2020 Total lbs lost to date: 2 Total lbs lost since last in-office visit: 0  Interim History: Diana Long has been following the Category 2 meal plan quite consistently and denies excessive cravings or polyphagia. She is frustrated with the slow progress and also eager to increase exercise intensity. She feels that breakfast and lunch have been fairly easy to follow and finds dinner more of a challenge to find foods that fit the protein/vegetable requirements and also meals that her mother will eat.  Subjective:   Vitamin D deficiency. Diana Long is on Ergocalciferol and denies nausea, vomiting, or muscle weakness.   Ref. Range 05/18/2020 11:33  Vitamin D, 25-Hydroxy Latest Ref Range: 30.0 - 100.0 ng/mL 29.2 (L)   Other specified hypothyroidism. Diana Long is on levothyroxine 100 mcg daily and denies excessive fatigue.   Lab Results  Component Value Date   TSH 1.450 05/18/2020   Assessment/Plan:   Vitamin D deficiency. Low Vitamin D level contributes to fatigue and are associated with obesity, breast, and colon cancer. She agrees to continue to take Ergocalciferol as directed and will follow-up for routine testing of Vitamin D every 3 months.  Other specified hypothyroidism. Patient with long-standing hypothyroidism, on levothyroxine therapy. She appears euthyroid. Orders and follow up as documented in patient record. Diana Long will continue levothyroxine as directed.  Counseling  Good thyroid control is important for overall health.  Supratherapeutic thyroid levels are dangerous and will not improve weight loss results.  The correct way to take levothyroxine is fasting, with water, separated by at least 30 minutes from breakfast, and separated by more than 4 hours from calcium, iron, multivitamins, acid reflux medications (PPIs).   Class 2 severe obesity with serious comorbidity and body mass index (BMI) of 36.0 to 36.9 in adult, unspecified obesity type ([place]).  Diana Long is currently in the action stage of change. As such, her goal is to continue with weight loss efforts. She has agreed to the Category 2 Plan and will journal 400-500 calories and 35+ grams of protein at supper.   Exercise goals: Diana Long will continue her current exercise regimen.   Behavioral modification strategies: increasing lean protein intake, increasing water intake, meal planning and cooking strategies and planning for success.  Diana Long has agreed to follow-up with our clinic in 2 weeks. She was informed of the importance of frequent follow-up visits to maximize her success with intensive lifestyle modifications for her multiple health conditions.   Objective:   Blood pressure 106/70, pulse 74, temperature 98.1 F (36.7 C), temperature source Oral, height 5\' 5"  (1.651 m), weight 221 lb (100.2 kg), last menstrual period 11/11/2016, SpO2 100 %. Body mass index is 36.78 kg/m.  General: Cooperative, alert, well developed, in no acute distress. HEENT: Conjunctivae and lids unremarkable. Cardiovascular: Regular rhythm.  Lungs: Normal work of breathing. Neurologic: No focal deficits.   Lab Results  Component Value Date   CREATININE 0.94 05/18/2020   BUN 16 05/18/2020   NA 140 05/18/2020   K 4.9 05/18/2020   CL 101 05/18/2020   CO2 22  05/18/2020   Lab Results  Component Value Date   ALT 62 (H) 05/18/2020   AST 38 05/18/2020   ALKPHOS 96 05/18/2020   BILITOT 1.0 05/18/2020   Lab Results  Component Value Date   HGBA1C 5.8 (H) 05/18/2020     Lab Results  Component Value Date   INSULIN 15.8 05/18/2020   Lab Results  Component Value Date   TSH 1.450 05/18/2020   Lab Results  Component Value Date   CHOL 181 05/18/2020   HDL 41 05/18/2020   LDLCALC 117 (H) 05/18/2020   TRIG 125 05/18/2020   Lab Results  Component Value Date   WBC 11.6 (H) 12/21/2016   HGB 11.8 (L) 12/21/2016   HCT 35.7 (L) 12/21/2016   MCV 80.0 12/21/2016   PLT 369 12/21/2016   No results found for: IRON, TIBC, FERRITIN  Attestation Statements:   Reviewed by clinician on day of visit: allergies, medications, problem list, medical history, surgical history, family history, social history, and previous encounter notes.  Time spent on visit including pre-visit chart review and post-visit charting and care was 29 minutes.   I, Michaelene Song, am acting as Location manager for [employer], NP-C   I have reviewed the above documentation for accuracy and completeness, and I agree with the above. - Esaw Grandchild, NP

## 2020-07-13 NOTE — Telephone Encounter (Signed)
Please see

## 2020-07-20 ENCOUNTER — Other Ambulatory Visit (INDEPENDENT_AMBULATORY_CARE_PROVIDER_SITE_OTHER): Payer: Self-pay | Admitting: Adult Health

## 2020-07-20 DIAGNOSIS — E559 Vitamin D deficiency, unspecified: Secondary | ICD-10-CM

## 2020-08-01 ENCOUNTER — Ambulatory Visit (INDEPENDENT_AMBULATORY_CARE_PROVIDER_SITE_OTHER): Payer: 59 | Admitting: Adult Health

## 2020-08-01 ENCOUNTER — Other Ambulatory Visit: Payer: Self-pay

## 2020-08-01 ENCOUNTER — Encounter (INDEPENDENT_AMBULATORY_CARE_PROVIDER_SITE_OTHER): Payer: Self-pay | Admitting: Adult Health

## 2020-08-01 VITALS — BP 114/82 | HR 81 | Temp 98.2°F | Ht 65.0 in | Wt 222.0 lb

## 2020-08-01 DIAGNOSIS — E559 Vitamin D deficiency, unspecified: Secondary | ICD-10-CM | POA: Diagnosis not present

## 2020-08-01 DIAGNOSIS — R7303 Prediabetes: Secondary | ICD-10-CM

## 2020-08-01 DIAGNOSIS — Z6837 Body mass index (BMI) 37.0-37.9, adult: Secondary | ICD-10-CM

## 2020-08-01 DIAGNOSIS — Z9189 Other specified personal risk factors, not elsewhere classified: Secondary | ICD-10-CM | POA: Diagnosis not present

## 2020-08-01 MED ORDER — VITAMIN D (ERGOCALCIFEROL) 1.25 MG (50000 UNIT) PO CAPS: 50000.0000 [IU] | ORAL_CAPSULE | ORAL | 0 refills | Status: DC

## 2020-08-01 NOTE — Progress Notes (Signed)
Chief Complaint:   OBESITY Diana Long is here to discuss her progress with her obesity treatment plan along with follow-up of her obesity related diagnoses. Diana Long is on the Category 2 Plan + 100 calories and states she is following her eating plan approximately 50% of the time. Diana Long states she is walking 30 minutes 2-3 times per week.  Today's visit was #: 6 Starting weight: 223 lbs Starting date: 05/18/2020 Today's weight: 222 lbs Today's date: 08/01/2020 Total lbs lost to date: 1 Total lbs lost since last in-office visit: 0  Interim History: Diana Long continues to be challenged with consuming all of the protein at dinner and has not been using the protein equivalent snacks. She denies excessive cravings or polyphagia.  Subjective:   Vitamin D deficiency. Diana Long is on prescription strength Vitamin D supplementation and denies nausea, vomiting, or muscle weakness.    Ref. Range 05/18/2020 11:33  Vitamin D, 25-Hydroxy Latest Ref Range: 30.0 - 100.0 ng/mL 29.2 (L)   Prediabetes. Diana Long has a diagnosis of prediabetes based on her elevated HgA1c and was informed this puts her at greater risk of developing diabetes. She continues to work on diet and exercise to decrease her risk of diabetes. She denies nausea or hypoglycemia. Diana Long is not on metformin. She denies polyphagia.  Lab Results  Component Value Date   HGBA1C 5.8 (H) 05/18/2020   Lab Results  Component Value Date   INSULIN 15.8 05/18/2020   At risk for osteoporosis. Diana Long is at higher risk of osteopenia and osteoporosis due to Vitamin D deficiency and obesity.   Assessment/Plan:   Vitamin D deficiency. Low Vitamin D level contributes to fatigue and are associated with obesity, breast, and colon cancer. She was given a refill on her Vitamin D, Ergocalciferol, (DRISDOL) 1.25 MG (50000 UNIT) CAPS capsule every week #4 with 0 refills and will follow-up for routine testing of Vitamin D at the end of August.    Prediabetes. Diana Long will continue to work on weight loss, exercise, and decreasing simple carbohydrates to help decrease the risk of diabetes.  She will continue to follow the Category 2 meal plan and will have labs checked the end of August.  At risk for osteoporosis. Diana Long was given approximately 15 minutes of osteoporosis prevention counseling today. Diana Long is at risk for osteopenia and osteoporosis due to her Vitamin D deficiency. She was encouraged to take her Vitamin D and follow her higher calcium diet and increase strengthening exercise to help strengthen her bones and decrease her risk of osteopenia and osteoporosis.  Repetitive spaced learning was employed today to elicit superior memory formation and behavioral change.  Class 2 severe obesity with serious comorbidity and body mass index (BMI) of 37.0 to 37.9 in adult, unspecified obesity type ([place]).  Diana Long is currently in the action stage of change. As such, her goal is to continue with weight loss efforts. She has agreed to the Category 2 Plan + 100 calories. She was encouraged to use Protein Equivalent snacks and change lunch/dinner timing in hopes of consuming all of her daily protein.  Handout was provided on Additional Breakfast Options.   Exercise goals: Diana Long will continue her current exercise regimen.   Behavioral modification strategies: increasing lean protein intake, no skipping meals, meal planning and cooking strategies and planning for success.  Diana Long has agreed to follow-up with our clinic in 3 weeks. She was informed of the importance of frequent follow-up visits to maximize her success with intensive lifestyle modifications for  her multiple health conditions.   Objective:   Blood pressure 114/82, pulse 81, temperature 98.2 F (36.8 C), temperature source Oral, height 5\' 5"  (1.651 m), weight 222 lb (100.7 kg), last menstrual period 11/11/2016, SpO2 99 %. Body mass index is 36.94 kg/m.  General:  Cooperative, alert, well developed, in no acute distress. HEENT: Conjunctivae and lids unremarkable. Cardiovascular: Regular rhythm.  Lungs: Normal work of breathing. Neurologic: No focal deficits.   Lab Results  Component Value Date   CREATININE 0.94 05/18/2020   BUN 16 05/18/2020   NA 140 05/18/2020   K 4.9 05/18/2020   CL 101 05/18/2020   CO2 22 05/18/2020   Lab Results  Component Value Date   ALT 62 (H) 05/18/2020   AST 38 05/18/2020   ALKPHOS 96 05/18/2020   BILITOT 1.0 05/18/2020   Lab Results  Component Value Date   HGBA1C 5.8 (H) 05/18/2020   Lab Results  Component Value Date   INSULIN 15.8 05/18/2020   Lab Results  Component Value Date   TSH 1.450 05/18/2020   Lab Results  Component Value Date   CHOL 181 05/18/2020   HDL 41 05/18/2020   LDLCALC 117 (H) 05/18/2020   TRIG 125 05/18/2020   Lab Results  Component Value Date   WBC 11.6 (H) 12/21/2016   HGB 11.8 (L) 12/21/2016   HCT 35.7 (L) 12/21/2016   MCV 80.0 12/21/2016   PLT 369 12/21/2016   No results found for: IRON, TIBC, FERRITIN  Attestation Statements:   Reviewed by clinician on day of visit: allergies, medications, problem list, medical history, surgical history, family history, social history, and previous encounter notes.  I, Michaelene Song, am acting as Location manager for [employer], NP-C   I have reviewed the above documentation for accuracy and completeness, and I agree with the above. -  Esaw Grandchild, NP

## 2020-08-22 ENCOUNTER — Other Ambulatory Visit: Payer: Self-pay

## 2020-08-22 ENCOUNTER — Encounter (INDEPENDENT_AMBULATORY_CARE_PROVIDER_SITE_OTHER): Payer: Self-pay | Admitting: Adult Health

## 2020-08-22 ENCOUNTER — Ambulatory Visit (INDEPENDENT_AMBULATORY_CARE_PROVIDER_SITE_OTHER): Payer: 59 | Admitting: Adult Health

## 2020-08-22 VITALS — BP 120/70 | HR 81 | Temp 98.0°F | Ht 65.0 in | Wt 220.0 lb

## 2020-08-22 DIAGNOSIS — E7849 Other hyperlipidemia: Secondary | ICD-10-CM | POA: Diagnosis not present

## 2020-08-22 DIAGNOSIS — R7303 Prediabetes: Secondary | ICD-10-CM

## 2020-08-22 DIAGNOSIS — Z6836 Body mass index (BMI) 36.0-36.9, adult: Secondary | ICD-10-CM

## 2020-08-22 DIAGNOSIS — E559 Vitamin D deficiency, unspecified: Secondary | ICD-10-CM

## 2020-08-22 DIAGNOSIS — Z9189 Other specified personal risk factors, not elsewhere classified: Secondary | ICD-10-CM

## 2020-08-22 MED ORDER — VITAMIN D (ERGOCALCIFEROL) 1.25 MG (50000 UNIT) PO CAPS: 50000.0000 [IU] | ORAL_CAPSULE | ORAL | 0 refills | Status: DC

## 2020-08-22 NOTE — Progress Notes (Signed)
Chief Complaint:   OBESITY Diana Long is here to discuss her progress with her obesity treatment plan along with follow-up of her obesity related diagnoses. Diana Long is on the Category 2 Plan and states she is following her eating plan approximately 80% of the time. Diana Long states she is exercising 0 minutes 0 times per week.  Today's visit was #: 7 Starting weight: 223 lbs Starting date: 05/18/2020 Today's weight: 220 lbs Today's date: 08/22/2020 Total lbs lost to date: 3 Total lbs lost since last in-office visit: 2  Interim History: Diana Long is eager to move out of their temporary apartment into the new home that she and her mother are building. She has been trying to increase protein at meals and snacks. She is fasting today, however, feels that she hasn't had enough water to successfully tolerate a lab draw. We will obtain labs at her next office visit and she will be sure to drink plenty of water prior to her next office visit.  Subjective:   Vitamin D deficiency. Diana Long is on Ergocalciferol. No nausea, vomiting, or muscle weakness.    Ref. Range 05/18/2020 11:33  Vitamin D, 25-Hydroxy Latest Ref Range: 30.0 - 100.0 ng/mL 29.2 (L)   Prediabetes. Diana Long has a diagnosis of prediabetes based on her elevated HgA1c and was informed this puts her at greater risk of developing diabetes. She continues to work on diet and exercise to decrease her risk of diabetes. She denies nausea or hypoglycemia. Diana Long is not on metformin and denies polyphagia.  Lab Results  Component Value Date   HGBA1C 5.8 (H) 05/18/2020   Lab Results  Component Value Date   INSULIN 15.8 05/18/2020   Other hyperlipidemia. Diana Long is on simvastatin 20 mg daily and denies myalgias.   Lab Results  Component Value Date   CHOL 181 05/18/2020   HDL 41 05/18/2020   LDLCALC 117 (H) 05/18/2020   TRIG 125 05/18/2020   Lab Results  Component Value Date   ALT 62 (H) 05/18/2020   AST 38 05/18/2020    ALKPHOS 96 05/18/2020   BILITOT 1.0 05/18/2020   The 10-year ASCVD risk score Diana Long DC Jr., et al., 2013) is: 1.1%   Values used to calculate the score:     Age: 49 years     Sex: Female     Is Non-Hispanic African American: No     Diabetic: No     Tobacco smoker: No     Systolic Blood Pressure: 413 mmHg     Is BP treated: No     HDL Cholesterol: 41 mg/dL     Total Cholesterol: 181 mg/dL  At risk for heart disease. Diana Long is at a higher than average risk for cardiovascular disease due to hyperlipidemia and obesity.   Assessment/Plan:   Vitamin D deficiency. Low Vitamin D level contributes to fatigue and are associated with obesity, breast, and colon cancer. She was given a refill on her Vitamin D, Ergocalciferol, (DRISDOL) 1.25 MG (50000 UNIT) CAPS capsule every week #4 with 0 refills and will follow-up for routine testing of Vitamin D at her next office visit.   Prediabetes.  Diana Long will continue to work on weight loss, exercise, and decreasing simple carbohydrates to help decrease the risk of diabetes. She will continue to focus on increasing her daily protein intake. Labs will be checked at her next office visit.  Other hyperlipidemia.  Cardiovascular risk and specific lipid/LDL goals reviewed.  We discussed several lifestyle modifications today and Diana Long  will continue to work on diet, exercise and weight loss efforts. Orders and follow up as documented in patient record. Labs will be checked at her next office visit.  Counseling Intensive lifestyle modifications are the first line treatment for this issue.  Dietary changes: Increase soluble fiber. Decrease simple carbohydrates.  Exercise changes: Moderate to vigorous-intensity aerobic activity 150 minutes per week if tolerated.  Lipid-lowering medications: see documented in medical record.  At risk for heart disease. Diana Long was given approximately 5 minutes of coronary artery disease prevention counseling today. She is 49  y.o. female and has risk factors for heart disease including obesity. We discussed intensive lifestyle modifications today with an emphasis on specific weight loss instructions and strategies.   Repetitive spaced learning was employed today to elicit superior memory formation and behavioral change.  Class 2 severe obesity with serious comorbidity and body mass index (BMI) of 36.0 to 36.9 in adult, unspecified obesity type ([place]).  Diana Long is currently in the action stage of change. As such, her goal is to continue with weight loss efforts. She has agreed to the Category 2 Plan + 100 snack calories.   Exercise goals: No exercise has been prescribed at this time. Diana Long states it's "too hot to exercise."  Behavioral modification strategies: increasing lean protein intake, increasing water intake, meal planning and cooking strategies and planning for success.  Diana Long has agreed to follow-up with our clinic fasting in 2-3 weeks. She was informed of the importance of frequent follow-up visits to maximize her success with intensive lifestyle modifications for her multiple health conditions.   Diana Long was informed we would discuss her lab results at her next visit unless there is a critical issue that needs to be addressed sooner. Diana Long agreed to keep her next visit at the agreed upon time to discuss these results.  Objective:   Blood pressure 120/70, pulse 81, temperature 98 F (36.7 C), temperature source Oral, height 5\' 5"  (1.651 m), weight 220 lb (99.8 kg), last menstrual period 11/11/2016, SpO2 98 %. Body mass index is 36.61 kg/m.  General: Cooperative, alert, well developed, in no acute distress. HEENT: Conjunctivae and lids unremarkable. Cardiovascular: Regular rhythm.  Lungs: Normal work of breathing. Neurologic: No focal deficits.   Lab Results  Component Value Date   CREATININE 0.94 05/18/2020   BUN 16 05/18/2020   NA 140 05/18/2020   K 4.9 05/18/2020   CL 101 05/18/2020    CO2 22 05/18/2020   Lab Results  Component Value Date   ALT 62 (H) 05/18/2020   AST 38 05/18/2020   ALKPHOS 96 05/18/2020   BILITOT 1.0 05/18/2020   Lab Results  Component Value Date   HGBA1C 5.8 (H) 05/18/2020   Lab Results  Component Value Date   INSULIN 15.8 05/18/2020   Lab Results  Component Value Date   TSH 1.450 05/18/2020   Lab Results  Component Value Date   CHOL 181 05/18/2020   HDL 41 05/18/2020   LDLCALC 117 (H) 05/18/2020   TRIG 125 05/18/2020   Lab Results  Component Value Date   WBC 11.6 (H) 12/21/2016   HGB 11.8 (L) 12/21/2016   HCT 35.7 (L) 12/21/2016   MCV 80.0 12/21/2016   PLT 369 12/21/2016   No results found for: IRON, TIBC, FERRITIN  Attestation Statements:   Reviewed by clinician on day of visit: allergies, medications, problem list, medical history, surgical history, family history, social history, and previous encounter notes.  Migdalia Dk, am acting as Location manager for  Mina Marble, NP-C   I have reviewed the above documentation for accuracy and completeness, and I agree with the above. -  Esaw Grandchild, NP

## 2020-08-23 ENCOUNTER — Other Ambulatory Visit (INDEPENDENT_AMBULATORY_CARE_PROVIDER_SITE_OTHER): Payer: Self-pay | Admitting: Adult Health

## 2020-08-23 DIAGNOSIS — E559 Vitamin D deficiency, unspecified: Secondary | ICD-10-CM

## 2020-09-12 ENCOUNTER — Ambulatory Visit (INDEPENDENT_AMBULATORY_CARE_PROVIDER_SITE_OTHER): Payer: 59 | Admitting: Adult Health

## 2020-09-12 ENCOUNTER — Encounter (INDEPENDENT_AMBULATORY_CARE_PROVIDER_SITE_OTHER): Payer: Self-pay | Admitting: Adult Health

## 2020-09-12 ENCOUNTER — Other Ambulatory Visit: Payer: Self-pay

## 2020-09-12 VITALS — BP 114/74 | HR 74 | Temp 97.7°F | Ht 65.0 in | Wt 223.0 lb

## 2020-09-12 DIAGNOSIS — Z6837 Body mass index (BMI) 37.0-37.9, adult: Secondary | ICD-10-CM

## 2020-09-12 DIAGNOSIS — Z9189 Other specified personal risk factors, not elsewhere classified: Secondary | ICD-10-CM | POA: Diagnosis not present

## 2020-09-12 DIAGNOSIS — E559 Vitamin D deficiency, unspecified: Secondary | ICD-10-CM | POA: Diagnosis not present

## 2020-09-12 DIAGNOSIS — E7849 Other hyperlipidemia: Secondary | ICD-10-CM

## 2020-09-12 DIAGNOSIS — R7303 Prediabetes: Secondary | ICD-10-CM | POA: Diagnosis not present

## 2020-09-12 MED ORDER — VITAMIN D (ERGOCALCIFEROL) 1.25 MG (50000 UNIT) PO CAPS: 50000.0000 [IU] | ORAL_CAPSULE | ORAL | 0 refills | Status: DC

## 2020-09-14 ENCOUNTER — Other Ambulatory Visit (INDEPENDENT_AMBULATORY_CARE_PROVIDER_SITE_OTHER): Payer: Self-pay | Admitting: Adult Health

## 2020-09-14 DIAGNOSIS — E559 Vitamin D deficiency, unspecified: Secondary | ICD-10-CM

## 2020-09-14 NOTE — Progress Notes (Signed)
Chief Complaint:   OBESITY Diana Long is here to discuss her progress with her obesity treatment plan along with follow-up of her obesity related diagnoses. Diana Long is on the Category 2 Plan and states she is following her eating plan approximately 80% of the time. Diana Long states she is exercising 0 minutes 0 times per week.  Today's visit was #: 8 Starting weight: 223 lbs Starting date: 05/18/2020 Today's weight: 223 lbs Today's date: 09/12/2020 Total lbs lost to date: 0 Total lbs lost since last in-office visit: 0  Interim History: Diana Long reports increased carbohydrate cravings and carbohydrate snacking the last few weeks, likely related to increased stress at work. She has been trying to choose snacks higher in protein content and limit carbohydrate intake.  Subjective:   Vitamin D deficiency. Diana Long is on Ergocalciferol. No nausea, vomiting, or muscle weakness.    Ref. Range 05/18/2020 11:33  Vitamin D, 25-Hydroxy Latest Ref Range: 30.0 - 100.0 ng/mL 29.2 (L)   Other hyperlipidemia pure. Diana Long is on simvastatin 20 mg and denies myalgias.   Lab Results  Component Value Date   CHOL 181 05/18/2020   HDL 41 05/18/2020   LDLCALC 117 (H) 05/18/2020   TRIG 125 05/18/2020   Lab Results  Component Value Date   ALT 62 (H) 05/18/2020   AST 38 05/18/2020   ALKPHOS 96 05/18/2020   BILITOT 1.0 05/18/2020   The 10-year ASCVD risk score Diana Bussing DC Jr., Diana al., Diana Long) is: 1%   Values used to calculate the score:     Age: 49 years     Sex: Female     Is Non-Hispanic African American: No     Diabetic: No     Tobacco smoker: No     Systolic Blood Pressure: 277 mmHg     Is BP treated: No     HDL Cholesterol: 41 mg/dL     Total Cholesterol: 181 mg/dL  Prediabetes. Diana Long has a diagnosis of prediabetes based on her elevated HgA1c and was informed this puts her at greater risk of developing diabetes. She continues to work on diet and exercise to decrease her risk of  diabetes. She denies nausea or hypoglycemia. Last A1c and insulin levels were both above goal. We discussed risks and benefits of metformin; Diana Long prefers to normalize levels with lifestyle changes.  Lab Results  Component Value Date   HGBA1C 5.8 (H) 05/18/2020   Lab Results  Component Value Date   INSULIN 15.8 05/18/2020   At risk for diabetes mellitus. Diana Long is at higher than average risk for developing diabetes due to prediabetes and obesity.   Assessment/Plan:   Vitamin D deficiency. Low Vitamin D level contributes to fatigue and are associated with obesity, breast, and colon cancer. She was given a refill on her Vitamin D, Ergocalciferol, (DRISDOL) 1.25 MG (50000 UNIT) CAPS capsule every week #4 with 0 refills and VITAMIN D 25 Hydroxy (Vit-D Deficiency, Fractures) level will be checked today.   Other hyperlipidemia pure. Cardiovascular risk and specific lipid/LDL goals reviewed.  We discussed several lifestyle modifications today and Diana Long will continue to work on diet, exercise and weight loss efforts. Orders and follow up as documented in patient record. Diana Long was instructed to remain well hydrated with water. Labs will be checked today.  Counseling Intensive lifestyle modifications are the first line treatment for this issue.  Dietary changes: Increase soluble fiber. Decrease simple carbohydrates.  Exercise changes: Moderate to vigorous-intensity aerobic activity 150 minutes per week if tolerated.  Lipid-lowering medications: see documented in medical record.  Prediabetes.  Diana Long will continue to work on weight loss, exercise, and decreasing simple carbohydrates to help decrease the risk of diabetes. She will continue to follow the Category 2 meal plan and increase daily walking. Labs will be checked today.  At risk for diabetes mellitus. Diana Long was given approximately 15 minutes of diabetes education and counseling today. We discussed intensive lifestyle modifications  today with an emphasis on weight loss as well as increasing exercise and decreasing simple carbohydrates in her diet. We also reviewed medication options with an emphasis on risk versus benefit of those discussed.   Repetitive spaced learning was employed today to elicit superior memory formation and behavioral change.  Class 2 severe obesity with serious comorbidity and body mass index (BMI) of 37.0 to 37.9 in adult, unspecified obesity type (Diana Long).  Diana Long is currently in the action stage of change. As such, her goal is to continue with weight loss efforts. She has agreed to the Category 2 Plan.   Exercise goals: No exercise has been prescribed at this time.  Behavioral modification strategies: increasing lean protein intake, increasing water intake, no skipping meals, meal planning and cooking strategies and planning for success.  Diana Long has agreed to follow-up with our clinic in 3 weeks. She was informed of the importance of frequent follow-up visits to maximize her success with intensive lifestyle modifications for her multiple health conditions.   Diana Long was informed we would discuss her lab results at her next visit unless there is a critical issue that needs to be addressed sooner. Diana Long agreed to keep her next visit at the agreed upon time to discuss these results.  Objective:   Blood pressure 114/74, pulse 74, temperature 97.7 F (36.5 C), height 5\' 5"  (1.651 m), weight 223 lb (101.2 kg), last menstrual period 11/11/2016, SpO2 100 %. Body mass index is 37.11 kg/m.  General: Cooperative, alert, well developed, in no acute distress. HEENT: Conjunctivae and lids unremarkable. Cardiovascular: Regular rhythm.  Lungs: Normal work of breathing. Neurologic: No focal deficits.   Lab Results  Component Value Date   CREATININE 0.94 05/18/2020   BUN 16 05/18/2020   NA 140 05/18/2020   K 4.9 05/18/2020   CL 101 05/18/2020   CO2 22 05/18/2020   Lab Results  Component Value Date    ALT 62 (H) 05/18/2020   AST 38 05/18/2020   ALKPHOS 96 05/18/2020   BILITOT 1.0 05/18/2020   Lab Results  Component Value Date   HGBA1C 5.8 (H) 05/18/2020   Lab Results  Component Value Date   INSULIN 15.8 05/18/2020   Lab Results  Component Value Date   TSH 1.450 05/18/2020   Lab Results  Component Value Date   CHOL 181 05/18/2020   HDL 41 05/18/2020   LDLCALC 117 (H) 05/18/2020   TRIG 125 05/18/2020   Lab Results  Component Value Date   WBC 11.6 (H) 12/21/2016   HGB 11.8 (L) 12/21/2016   HCT 35.7 (L) 12/21/2016   MCV 80.0 12/21/2016   PLT 369 12/21/2016   No results found for: IRON, TIBC, FERRITIN  Attestation Statements:   Reviewed by clinician on day of visit: allergies, medications, problem list, medical history, surgical history, family history, social history, and previous encounter notes.  I, Michaelene Song, am acting as Location manager for [employer], NP-C   I have reviewed the above documentation for accuracy and completeness, and I agree with the above. -  Esaw Grandchild, NP

## 2020-09-30 LAB — INSULIN, RANDOM: INSULIN: 24.5 u[IU]/mL (ref 2.6–24.9)

## 2020-09-30 LAB — HEMOGLOBIN A1C
Est. average glucose Bld gHb Est-mCnc: 117 mg/dL
Hgb A1c MFr Bld: 5.7 % — ABNORMAL HIGH (ref 4.8–5.6)

## 2020-09-30 LAB — COMPREHENSIVE METABOLIC PANEL
ALT: 36 IU/L — ABNORMAL HIGH (ref 0–32)
AST: 23 IU/L (ref 0–40)
Albumin/Globulin Ratio: 1.9 (ref 1.2–2.2)
Albumin: 4.6 g/dL (ref 3.8–4.8)
Alkaline Phosphatase: 83 IU/L (ref 44–121)
BUN/Creatinine Ratio: 14 (ref 9–23)
BUN: 14 mg/dL (ref 6–24)
Bilirubin Total: 0.9 mg/dL (ref 0.0–1.2)
CO2: 23 mmol/L (ref 20–29)
Calcium: 9.5 mg/dL (ref 8.7–10.2)
Chloride: 102 mmol/L (ref 96–106)
Creatinine, Ser: 0.98 mg/dL (ref 0.57–1.00)
GFR calc Af Amer: 79 mL/min/{1.73_m2} (ref >59)
GFR calc non Af Amer: 68 mL/min/{1.73_m2} (ref >59)
Globulin, Total: 2.4 g/dL (ref 1.5–4.5)
Glucose: 119 mg/dL — ABNORMAL HIGH (ref 65–99)
Potassium: 4.7 mmol/L (ref 3.5–5.2)
Sodium: 138 mmol/L (ref 134–144)
Total Protein: 7 g/dL (ref 6.0–8.5)

## 2020-09-30 LAB — VITAMIN D 25 HYDROXY (VIT D DEFICIENCY, FRACTURES): Vit D, 25-Hydroxy: 35.2 ng/mL (ref 30.0–100.0)

## 2020-10-01 ENCOUNTER — Emergency Department (HOSPITAL_BASED_OUTPATIENT_CLINIC_OR_DEPARTMENT_OTHER)
Admission: EM | Admit: 2020-10-01 | Discharge: 2020-10-01 | Disposition: A | Discharge status: Home / Self Care | Payer: 59 | Attending: Emergency Medicine | Admitting: Emergency Medicine

## 2020-10-01 ENCOUNTER — Other Ambulatory Visit: Payer: Self-pay

## 2020-10-01 DIAGNOSIS — E039 Hypothyroidism, unspecified: Secondary | ICD-10-CM | POA: Diagnosis not present

## 2020-10-01 DIAGNOSIS — K920 Hematemesis: Secondary | ICD-10-CM | POA: Insufficient documentation

## 2020-10-01 DIAGNOSIS — Z79899 Other long term (current) drug therapy: Secondary | ICD-10-CM | POA: Insufficient documentation

## 2020-10-01 DIAGNOSIS — R112 Nausea with vomiting, unspecified: Secondary | ICD-10-CM | POA: Diagnosis present

## 2020-10-01 DIAGNOSIS — R197 Diarrhea, unspecified: Secondary | ICD-10-CM | POA: Insufficient documentation

## 2020-10-01 DIAGNOSIS — Z79890 Hormone replacement therapy (postmenopausal): Secondary | ICD-10-CM | POA: Diagnosis not present

## 2020-10-01 MED ORDER — FAMOTIDINE 20 MG PO TABS
40.0000 mg | ORAL_TABLET | Freq: Once | ORAL | Status: AC
  Administered 2020-10-01: 40 mg via ORAL
  Filled 2020-10-01: qty 2

## 2020-10-01 MED ORDER — ONDANSETRON 4 MG PO TBDP: 4.0000 mg | ORAL_TABLET | Freq: Three times a day (TID) | ORAL | 0 refills | Status: DC | PRN

## 2020-10-01 MED ORDER — ONDANSETRON 4 MG PO TBDP
8.0000 mg | ORAL_TABLET | Freq: Once | ORAL | Status: AC
  Administered 2020-10-01: 8 mg via ORAL
  Filled 2020-10-01: qty 2

## 2020-10-01 NOTE — ED Triage Notes (Signed)
Pt states awoke this morning with diarrhea, vomited x1.  Denies fever.  Denies abdominal pain

## 2020-10-01 NOTE — ED Notes (Signed)
ED Provider at bedside. 

## 2020-10-01 NOTE — ED Notes (Signed)
Pt tolerating po fluids, no vomiting, nausea improved.

## 2020-10-01 NOTE — ED Provider Notes (Signed)
[facility] EM[facility]GENCY DEPARTMENT Provider Note   CSN: [id number] Arrival date & time: 10/01/20  6063     History Chief Complaint  Patient presents with  . Emesis    Diana Long is a 49 y.o. female.  49 year old female with past medical history below including hypothyroidism, hyperlipidemia, prediabetes who presents with vomiting and diarrhea.  Patient states that she did not sleep well last night, was restless and up throughout the night.  Early this morning, she got up and had a few episodes of nonbloody diarrhea.  She then started to feel nauseated and vomited once.  She states that there was some blood in her emesis which scared her and prompted her to come in for evaluation.  She denies any associated abdominal pain.  She has been told she has had fatty liver in the past but denies any history of cirrhosis, heavy alcohol use, heavy NSAID use, or anticoagulant use.  No sick contacts or recent travel.  No URI symptoms or urinary symptoms.  The history is provided by the patient.  Emesis      Past Medical History:  Diagnosis Date  . Headache    hx of migraines years ago  . High cholesterol   . History of kidney stones   . Hormone replacement therapy   . Hypothyroidism   . Prediabetes     Patient Active Problem List   Diagnosis Date Noted  . Other hyperlipidemia 08/22/2020  . Other specified hypothyroidism 07/12/2020  . Vitamin D deficiency 06/15/2020  . Prediabetes 06/15/2020  . Class 2 severe obesity with serious comorbidity and body mass index (BMI) of 37.0 to 37.9 in adult [facility]) 06/15/2020  . Ovarian low malignant potential tumor 01/16/2017  . Elevated CA-125 12/20/2016  . Pelvic mass in female 12/20/2016  . Right tubo-ovarian mass 12/10/2016    Past Surgical History:  Procedure Laterality Date  . ABDOMINAL HYSTERECTOMY Left 12/20/2016   Procedure: HYSTERECTOMY ABDOMINAL WITH BILATERAL  SALPINGO OOPHORECTOMY;  Surgeon: Everitt Amber, MD;  Location:  [facility];  Service: Gynecology;  Laterality: Left;  . BREAST SURGERY     breast biopsy- right? 10 years ago  . EXTRACORPOREAL SHOCK WAVE LITHOTRIPSY Left 09/26/2017   Procedure: LEFT EXTRACORPOREAL SHOCK WAVE LITHOTRIPSY (ESWL);  Surgeon: Kathie Rhodes, MD;  Location: [facility];  Service: Urology;  Laterality: Left;  . LAPAROTOMY N/A 12/20/2016   Procedure: EXPLORATORY LAPAROTOMY;  Surgeon: Everitt Amber, MD;  Location: [facility];  Service: Gynecology;  Laterality: N/A;  . OMENTECTOMY N/A 12/20/2016   Procedure: OMENTECTOMY WITH LEFT URETEROLYSIS;  Surgeon: Everitt Amber, MD;  Location: [facility];  Service: Gynecology;  Laterality: N/A;     OB History    Gravida  0   Para  0   Term  0   Preterm  0   AB  0   Living  0     SAB  0   TAB  0   Ectopic  0   Multiple  0   Live Births  0           Family History  Problem Relation Age of Onset  . Hypertension Mother   . Hyperlipidemia Mother   . Anxiety disorder Mother   . Diabetes Father   . Anxiety disorder Father     Social History   Tobacco Use  . Smoking status: Never Smoker  . Smokeless tobacco: Never Used  Substance Use Topics  . Alcohol use: Yes    Comment: social  .  Drug use: No    Home Medications Prior to Admission medications   Medication Sig Start Date End Date Taking? Authorizing Provider  ELDERBERRY PO Take 100 mg by mouth.    [provider]  estradiol (VIVELLE-DOT) 0.05 MG/24HR patch Place 1 patch (0.05 mg total) onto the skin 2 (two) times a week. 01/17/17   Everitt Amber, MD  levothyroxine (SYNTHROID) 100 MCG tablet Take 100 mcg by mouth.     [provider]  Multiple Vitamin (MULTIVITAMIN) tablet Take 1 tablet by mouth daily.    [provider]  ondansetron (ZOFRAN ODT) 4 MG disintegrating tablet Take 1 tablet (4 mg total) by mouth every 8 (eight) hours as needed for nausea or vomiting. 10/01/20   Tobin Cadiente, Wenda Overland, MD  simvastatin (ZOCOR) 20 MG tablet Take 1 tablet (20 mg total)  by mouth daily at 6 PM. 06/01/20   Eber Jones, MD  Vitamin D, Ergocalciferol, (DRISDOL) 1.25 MG (50000 UNIT) CAPS capsule Take 1 capsule (50,000 Units total) by mouth every 7 (seven) days. 09/12/20   Esaw Grandchild, NP    Allergies    Patient has no known allergies.  Review of Systems   Review of Systems  Gastrointestinal: Positive for vomiting.   All other systems reviewed and are negative except that which was mentioned in HPI  Physical Exam Updated Vital Signs BP 114/66 (BP Location: Right Arm)   Pulse 78   Temp 98.1 F (36.7 C) (Oral)   Resp 16   Ht 5\' 5"  (1.651 m)   Wt 101 kg   LMP 11/11/2016   SpO2 100%   BMI 37.05 kg/m   Physical Exam Vitals and nursing note reviewed.  Constitutional:      General: She is not in acute distress.    Appearance: She is well-developed.  HENT:     Head: Normocephalic and atraumatic.  Eyes:     Conjunctiva/sclera: Conjunctivae normal.  Cardiovascular:     Rate and Rhythm: Normal rate and regular rhythm.     Heart sounds: Normal heart sounds. No murmur heard.   Pulmonary:     Effort: Pulmonary effort is normal.     Breath sounds: Normal breath sounds.  Abdominal:     General: Bowel sounds are normal. There is no distension.     Palpations: Abdomen is soft.     Tenderness: There is no abdominal tenderness.  Musculoskeletal:     Cervical back: Neck supple.  Skin:    General: Skin is warm and dry.  Neurological:     Mental Status: She is alert and oriented to person, place, and time.     Comments: Fluent speech  Psychiatric:        Judgment: Judgment normal.     ED Results / Procedures / Treatments   Labs (all labs ordered are listed, but only abnormal results are displayed) Labs Reviewed - No data to display  EKG None  Radiology No results found.  Procedures Procedures (including critical care time)  Medications Ordered in ED Medications  ondansetron (ZOFRAN-ODT) disintegrating tablet 8 mg (8 mg Oral  Given 10/01/20 0837)  famotidine (PEPCID) tablet 40 mg (40 mg Oral Given 10/01/20 0915)    ED Course  I have reviewed the triage vital signs and the nursing notes.     MDM Rules/Calculators/A&P                          Well-appearing and comfortable on exam  with normal vital signs, abdomen nontender.  Denying any complaints of pain.  Given no preceding symptoms leading up to episode this morning, I doubt esophageal varices; gastritis or PUD are possible although no pain leading up to today. Discussed possibility of Mallory-Weiss tear.  Gave Zofran, Pepcid, and p.o. challenged. PT tolerated fluids without problems, comfortable on reassessment. States nausea resolved and still has no abdominal pain. Recommended continuing pepcid for a few days and zofran PRN. Discussed supportive measures for diarrhea. Extensively reviewed return precautions including further bloody emesis, new abd pain, or bloody stools. She voiced understanding.  Final Clinical Impression(s) / ED Diagnoses Final diagnoses:  Nausea vomiting and diarrhea  Symptom of blood in vomit    Rx / DC Orders ED Discharge Orders         Ordered    ondansetron (ZOFRAN ODT) 4 MG disintegrating tablet  Every 8 hours PRN        10/01/20 1023           Georgetta Crafton, Wenda Overland, MD 10/01/20 1127

## 2020-10-01 NOTE — ED Notes (Signed)
Ambulates to bathroom with steady gait

## 2020-10-03 ENCOUNTER — Ambulatory Visit (INDEPENDENT_AMBULATORY_CARE_PROVIDER_SITE_OTHER): Payer: 59 | Admitting: Adult Health

## 2020-10-06 ENCOUNTER — Encounter (INDEPENDENT_AMBULATORY_CARE_PROVIDER_SITE_OTHER): Payer: Self-pay

## 2020-10-10 ENCOUNTER — Ambulatory Visit (INDEPENDENT_AMBULATORY_CARE_PROVIDER_SITE_OTHER): Payer: 59 | Admitting: Adult Health

## 2020-10-10 ENCOUNTER — Encounter (INDEPENDENT_AMBULATORY_CARE_PROVIDER_SITE_OTHER): Payer: Self-pay | Admitting: Adult Health

## 2020-10-10 ENCOUNTER — Other Ambulatory Visit: Payer: Self-pay

## 2020-10-10 VITALS — BP 112/75 | HR 79 | Temp 98.4°F | Ht 65.0 in | Wt 222.0 lb

## 2020-10-10 DIAGNOSIS — E559 Vitamin D deficiency, unspecified: Secondary | ICD-10-CM | POA: Diagnosis not present

## 2020-10-10 DIAGNOSIS — E7849 Other hyperlipidemia: Secondary | ICD-10-CM | POA: Diagnosis not present

## 2020-10-10 DIAGNOSIS — R7401 Elevation of levels of liver transaminase levels: Secondary | ICD-10-CM

## 2020-10-10 DIAGNOSIS — R7303 Prediabetes: Secondary | ICD-10-CM

## 2020-10-10 DIAGNOSIS — Z9189 Other specified personal risk factors, not elsewhere classified: Secondary | ICD-10-CM | POA: Diagnosis not present

## 2020-10-10 DIAGNOSIS — Z6837 Body mass index (BMI) 37.0-37.9, adult: Secondary | ICD-10-CM

## 2020-10-10 MED ORDER — METFORMIN HCL 500 MG PO TABS: ORAL_TABLET | ORAL | 0 refills | Status: DC

## 2020-10-11 DIAGNOSIS — R7401 Elevation of levels of liver transaminase levels: Secondary | ICD-10-CM | POA: Insufficient documentation

## 2020-10-11 NOTE — Progress Notes (Signed)
Chief Complaint:   OBESITY Diana Long is here to discuss her progress with her obesity treatment plan along with follow-up of her obesity related diagnoses. Diana Long is on the Category 2 Plan and states she is following her eating plan approximately 60% of the time. Diana Long states she is walking 30-45 minutes 1-2 times per week.  Today's visit was #: 9 Starting weight: 223 lbs Starting date: 05/18/2020 Today's weight: 222 lbs Today's date: 10/10/2020 Total lbs lost to date: 1 Total lbs lost since last in-office visit: 1  Interim History: Diana Long has been incredibly stressed lately due to work and upcoming move into her new home. She recently established with a therapist and has her second appointment next week. She will be taking 2 weeks off once she closes on her home. She feels frustrated with weight loss plateau.  Subjective:   Vitamin D deficiency. Vitamin D level on 09/29/2020 was 35.2, below goal of 50. Diana Long is on Ergocalciferol. No nausea, vomiting, or muscle weakness.    Ref. Range 09/29/2020 08:19  Vitamin D, 25-Hydroxy Latest Ref Range: 30.0 - 100.0 ng/mL 35.2   Prediabetes. Diana Long has a diagnosis of prediabetes based on her elevated HgA1c and was informed this puts her at greater risk of developing diabetes. She continues to work on diet and exercise to decrease her risk of diabetes. She denies nausea or hypoglycemia. 09/29/2020 blood glucose 119, A1c 5.7 with an insulin level of 24.5. Her father has diabetes. A1c continues to be elevated despite healthy eating. CMP 09/29/2020 GFR was greater than 60.  Lab Results  Component Value Date   HGBA1C 5.7 (H) 09/29/2020   Lab Results  Component Value Date   INSULIN 24.5 09/29/2020   INSULIN 15.8 05/18/2020   Transaminitis. 09/29/2020 ALT was much improved at 36, down from 62 on 05/18/2020.  Other hyperlipidemia. Labs were ordered, however, apparently were not collected.   Lab Results  Component Value Date    CHOL 181 05/18/2020   HDL 41 05/18/2020   LDLCALC 117 (H) 05/18/2020   TRIG 125 05/18/2020   Lab Results  Component Value Date   ALT 36 (H) 09/29/2020   AST 23 09/29/2020   ALKPHOS 83 09/29/2020   BILITOT 0.9 09/29/2020   The 10-year ASCVD risk score Mikey Bussing DC Jr., et al., 2013) is: 1%   Values used to calculate the score:     Age: 49 years     Sex: Female     Is Non-Hispanic African American: No     Diabetic: No     Tobacco smoker: No     Systolic Blood Pressure: 818 mmHg     Is BP treated: No     HDL Cholesterol: 41 mg/dL     Total Cholesterol: 181 mg/dL  At risk for diabetes mellitus. Diana Long is at higher than average risk for developing diabetes due to prediabetes and obesity.   Assessment/Plan:   Vitamin D deficiency. Low Vitamin D level contributes to fatigue and are associated with obesity, breast, and colon cancer. She agrees to continue to take Ergocalciferol (no medication refill today) and will follow-up for routine testing of Vitamin D, at least 2-3 times per year to avoid over-replacement.  Prediabetes. Diana Long will continue to work on weight loss, exercise, and decreasing simple carbohydrates to help decrease the risk of diabetes. She will start metFORMIN (GLUCOPHAGE) 500 MG tablet 1/2 with breakfast (250 mg daily).  Transaminitis. Diana Long will continue healthy eating.  Other hyperlipidemia. Cardiovascular risk and  specific lipid/LDL goals reviewed.  We discussed several lifestyle modifications today and Diana Long will continue to work on diet, exercise and weight loss efforts. Orders and follow up as documented in patient record. Diana Long will continue statin therapy. Will obtain lipid panel at her next lab draw.  Counseling Intensive lifestyle modifications are the first line treatment for this issue. . Dietary changes: Increase soluble fiber. Decrease simple carbohydrates. . Exercise changes: Moderate to vigorous-intensity aerobic activity 150 minutes per week if  tolerated. . Lipid-lowering medications: see documented in medical record.  At risk for diabetes mellitus. Diana Long was given approximately 15 minutes of diabetes education and counseling today. We discussed intensive lifestyle modifications today with an emphasis on weight loss as well as increasing exercise and decreasing simple carbohydrates in her diet. We also reviewed medication options with an emphasis on risk versus benefit of those discussed.   Repetitive spaced learning was employed today to elicit superior memory formation and behavioral change.  Class 2 severe obesity with serious comorbidity and body mass index (BMI) of 37.0 to 37.9 in adult, unspecified obesity type ([place]).  Diana Long is currently in the action stage of change. As such, her goal is to continue with weight loss efforts. She has agreed to the Category 2 Plan.   Exercise goals: Diana Long will continue walking 30-45 minutes 1-2 times per week.  Behavioral modification strategies: increasing lean protein intake, increasing water intake, meal planning and cooking strategies and planning for success.  Diana Long has agreed to follow-up with our clinic in 2 weeks. She was informed of the importance of frequent follow-up visits to maximize her success with intensive lifestyle modifications for her multiple health conditions.   Objective:   Blood pressure 112/75, pulse 79, temperature 98.4 F (36.9 C), height 5\' 5"  (1.651 m), weight 222 lb (100.7 kg), last menstrual period 11/11/2016, SpO2 99 %. Body mass index is 36.94 kg/m.  General: Cooperative, alert, well developed, in no acute distress. HEENT: Conjunctivae and lids unremarkable. Cardiovascular: Regular rhythm.  Lungs: Normal work of breathing. Neurologic: No focal deficits.   Lab Results  Component Value Date   CREATININE 0.98 09/29/2020   BUN 14 09/29/2020   NA 138 09/29/2020   K 4.7 09/29/2020   CL 102 09/29/2020   CO2 23 09/29/2020   Lab Results  Component  Value Date   ALT 36 (H) 09/29/2020   AST 23 09/29/2020   ALKPHOS 83 09/29/2020   BILITOT 0.9 09/29/2020   Lab Results  Component Value Date   HGBA1C 5.7 (H) 09/29/2020   HGBA1C 5.8 (H) 05/18/2020   Lab Results  Component Value Date   INSULIN 24.5 09/29/2020   INSULIN 15.8 05/18/2020   Lab Results  Component Value Date   TSH 1.450 05/18/2020   Lab Results  Component Value Date   CHOL 181 05/18/2020   HDL 41 05/18/2020   LDLCALC 117 (H) 05/18/2020   TRIG 125 05/18/2020   Lab Results  Component Value Date   WBC 11.6 (H) 12/21/2016   HGB 11.8 (L) 12/21/2016   HCT 35.7 (L) 12/21/2016   MCV 80.0 12/21/2016   PLT 369 12/21/2016   No results found for: IRON, TIBC, FERRITIN  Attestation Statements:   Reviewed by clinician on day of visit: allergies, medications, problem list, medical history, surgical history, family history, social history, and previous encounter notes.  IMichaelene Song, am acting as Location manager for [employer], NP-C   I have reviewed the above documentation for accuracy and completeness, and I agree  with the above. -  Ozelle Brubacher d. [place], NP-C

## 2020-10-12 ENCOUNTER — Other Ambulatory Visit (INDEPENDENT_AMBULATORY_CARE_PROVIDER_SITE_OTHER): Payer: Self-pay | Admitting: Adult Health

## 2020-10-12 DIAGNOSIS — E559 Vitamin D deficiency, unspecified: Secondary | ICD-10-CM

## 2020-10-25 ENCOUNTER — Ambulatory Visit (INDEPENDENT_AMBULATORY_CARE_PROVIDER_SITE_OTHER): Payer: 59 | Admitting: Adult Health

## 2020-11-07 ENCOUNTER — Ambulatory Visit (INDEPENDENT_AMBULATORY_CARE_PROVIDER_SITE_OTHER): Payer: 59 | Admitting: Family Medicine

## 2020-11-07 ENCOUNTER — Other Ambulatory Visit: Payer: Self-pay

## 2020-11-07 ENCOUNTER — Encounter (INDEPENDENT_AMBULATORY_CARE_PROVIDER_SITE_OTHER): Payer: Self-pay | Admitting: Family Medicine

## 2020-11-07 VITALS — BP 112/76 | HR 86 | Temp 98.3°F | Ht 65.0 in | Wt 222.0 lb

## 2020-11-07 DIAGNOSIS — R7303 Prediabetes: Secondary | ICD-10-CM | POA: Diagnosis not present

## 2020-11-07 DIAGNOSIS — Z6837 Body mass index (BMI) 37.0-37.9, adult: Secondary | ICD-10-CM | POA: Diagnosis not present

## 2020-11-09 ENCOUNTER — Encounter (INDEPENDENT_AMBULATORY_CARE_PROVIDER_SITE_OTHER): Payer: Self-pay | Admitting: Family Medicine

## 2020-11-09 NOTE — Progress Notes (Signed)
Chief Complaint:   OBESITY Diana Long is here to discuss her progress with her obesity treatment plan along with follow-up of her obesity related diagnoses. Diana Long is on the Category 2 Plan and states she is following her eating plan approximately 60% of the time. Diana Long states she is doing 0 minutes 0 times per week.  Today's visit was #: 10 Starting weight: 223 lbs Starting date: 05/18/2020 Today's weight: 222 lbs Today's date: 11/07/2020 Total lbs lost to date: 1 Total lbs lost since last in-office visit: 0  Interim History: Diana Long does well on the plan at breakfast and lunch. She struggles with dinner. She does not like cooking much and definitely will not cook recipes with many steps and ingredients. She eats out on the weekends but not during the week. She tends to eat too many calories at supper. She lives with her mother and the do eat together sometimes but not always.  Subjective:   1. Pre-diabetes Diana Long has not started the metformin that was prescribed at her last office visit. She denies excessive hunger now that she is eating all of the plan food.   Lab Results  Component Value Date   HGBA1C 5.7 (H) 09/29/2020   Lab Results  Component Value Date   INSULIN 24.5 09/29/2020   INSULIN 15.8 05/18/2020   Assessment/Plan:   1. Pre-diabetes Diana Long will continue her meal plan, and continue to work on weight loss, exercise, and decreasing simple carbohydrates. I will e-mail her a article about metformin htat discusses benefits for weight loss.   2. Class 2 severe obesity with serious comorbidity and body mass index (BMI) of 37.0 to 37.9 in adult, unspecified obesity type (HCC) Diana Long is currently in the action stage of change. As such, her goal is to continue with weight loss efforts. She has agreed to the Category 2 Plan and keeping a food journal and adhering to recommended goals of 400-500 calories and 35-40 grams of protein at supper daily.   We discussed dinner  ideas that require very little cooking: Chipotle, rotisserie chicken, pork loin, and microwave meals, etc.  Handout was given today: Protein Content of Food.  Exercise goals: No exercise has been prescribed at this time.  Behavioral modification strategies: decreasing simple carbohydrates and meal planning and cooking strategies.  Diana Long has agreed to follow-up with our clinic in 3 weeks with Mina Marble, NP.  Objective:   Blood pressure 112/76, pulse 86, temperature 98.3 F (36.8 C), height 5\' 5"  (1.651 m), weight 222 lb (100.7 kg), last menstrual period 11/11/2016, SpO2 99 %. Body mass index is 36.94 kg/m.  General: Cooperative, alert, well developed, in no acute distress. HEENT: Conjunctivae and lids unremarkable. Cardiovascular: Regular rhythm.  Lungs: Normal work of breathing. Neurologic: No focal deficits.   Lab Results  Component Value Date   CREATININE 0.98 09/29/2020   BUN 14 09/29/2020   NA 138 09/29/2020   K 4.7 09/29/2020   CL 102 09/29/2020   CO2 23 09/29/2020   Lab Results  Component Value Date   ALT 36 (H) 09/29/2020   AST 23 09/29/2020   ALKPHOS 83 09/29/2020   BILITOT 0.9 09/29/2020   Lab Results  Component Value Date   HGBA1C 5.7 (H) 09/29/2020   HGBA1C 5.8 (H) 05/18/2020   Lab Results  Component Value Date   INSULIN 24.5 09/29/2020   INSULIN 15.8 05/18/2020   Lab Results  Component Value Date   TSH 1.450 05/18/2020   Lab Results  Component Value  Date   CHOL 181 05/18/2020   HDL 41 05/18/2020   LDLCALC 117 (H) 05/18/2020   TRIG 125 05/18/2020   Lab Results  Component Value Date   WBC 11.6 (H) 12/21/2016   HGB 11.8 (L) 12/21/2016   HCT 35.7 (L) 12/21/2016   MCV 80.0 12/21/2016   PLT 369 12/21/2016   No results found for: IRON, TIBC, FERRITIN  Attestation Statements:   Reviewed by clinician on day of visit: allergies, medications, problem list, medical history, surgical history, family history, social history, and previous  encounter notes.   Wilhemena Durie, am acting as Location manager for [employer], FNP-C.  I have reviewed the above documentation for accuracy and completeness, and I agree with the above. -  Georgianne Fick, FNP

## 2020-11-10 ENCOUNTER — Ambulatory Visit: Payer: 59

## 2020-11-28 ENCOUNTER — Ambulatory Visit (INDEPENDENT_AMBULATORY_CARE_PROVIDER_SITE_OTHER): Payer: 59 | Admitting: Adult Health

## 2022-08-01 ENCOUNTER — Encounter (INDEPENDENT_AMBULATORY_CARE_PROVIDER_SITE_OTHER): Payer: Self-pay

## 2024-08-19 NOTE — Progress Notes (Signed)
 SUBJECTIVE   Patient ID: Diana Long is a 53 y.o. (DOB [birth year]) female   Chief Complaint  Patient presents with  . Anxiety    GAD-7 = 0  . Depression    PHQ-9 = 0  . Hyperlipidemia  . Vitamin D  Deficiency  . Diabetes   Diabetes:  (newly diagnosed) Patient presents today for follow up and medical management of diabetes.  - There were changes in medical management at the last visit. - Stopped metformin  due to GI side effects. - Started Mounjaro 2.5 mg once a week - Current regimen: Mounjaro 2.5 mg once a week - Patient is taking medication as prescribed. - Home blood sugars have not been checked. - Patient denies episodes of hypoglycemia. - Patient is following the prescribed diabetic diet. - Patient is exercising regularly as recommended. - Patient's weight has decreased since last visit by 10 lbs. - Weight check on 07/24/24 was 244.6 lbs before starting Mounjaro. - She is down 14 lbs over the past 4 weeks.  Lab Results  Component Value Date   HGBA1C 6.0 (H) 08/18/2024   HGBA1C 6.4 (H) 02/17/2024   HGBA1C 6.4 (H) 07/15/2023    Wt Readings from Last 3 Encounters:  08/19/24 105 kg (230 lb 9.6 oz)  02/20/24 109 kg (240 lb)  07/17/23 111 kg (244 lb 6.4 oz)   Hyperlipidemia:  Patient presents today for follow up and medical management of hyperlipidemia. - There were not medication changes at last visit. - Current regimen: simvastatin  20 mg at bedtime  - Patient is taking medication as prescribed. - Patient denies muscle aches, denies fatigue.  - Patient is not following a low cholesterol diet. - Patient is exercising regularly as recommended.  - Patient's weight has decreased since last visit by 10 lbs.  Lab Results  Component Value Date   LDLCALC 83 08/18/2024   LDLCALC 105 (H) 02/17/2024    Vitamin D  Deficiency/Insufficiency: Patient presents today for follow-up and medical management of vitamin D  deficiency/insufficiency. - Medication changes at last  visit. yes - Changes were: increase to 5,000 units every day  - Patient is taking medication as directed. - Current dose: 5,000 units every day  .  - Last vitamin D  result was 29.5 on 02/17/24.  Anxiety/Depression:  Continues to take escitalopram 5 mg daily with no side effects.  Mood is stable on current regimen  Most recent GAD-7 result: GAD-7 Total Score: 0 (08/19/24 0921)     Behavioral Health Screening  Patient Health Questionnaire-2 Score: 0 (08/19/2024  9:20 AM)  Patient Health Questionnaire-9 Score: 0 (08/19/2024  9:20 AM)   PHQ Question # 9 Thoughts that you would be better off dead or hurting yourself in some way: Not at all    Patient's Depression screening/score = Negative    Depression Plan: Normal/Negative Screening    Results for orders placed or performed in visit on 08/18/24  Vitamin D , 25-Hydroxy   Collection Time: 08/18/24  8:20 AM  Result Value Ref Range   Vitamin D  25-Hydroxy 52.4 30.0 - 100.0 ng/mL  Lipid Panel With Reflex Direct LDL   Collection Time: 08/18/24  8:20 AM  Result Value Ref Range   Cholesterol, Total, Lipid Panel 133 <200 mg/dL   Triglycerides, Lipid Panel 99 <150 mg/dL   HDL Cholesterol - Lipid Panel 31 (L) >=60 mg/dL   LDL Cholesterol, Calculated 83 <100 mg/dL   Non-HDL Cholesterol 897 mg/dL  Hemoglobin J8R With Estimated Average Glucose   Collection Time: 08/18/24  8:20  AM  Result Value Ref Range   Hemoglobin A1c 6.0 (H) <5.7 %   Estimated Average Glucose 126 mg/dL  Comprehensive Metabolic Panel   Collection Time: 08/18/24  8:20 AM  Result Value Ref Range   Sodium 140 136 - 145 mmol/L   Potassium 4.3 3.5 - 5.1 mmol/L   Chloride 106 98 - 107 mmol/L   CO2 25 21 - 31 mmol/L   Anion Gap 9 6 - 14 mmol/L   Glucose, Random 105 (H) 70 - 99 mg/dL   Blood Urea Nitrogen (BUN) 16 7 - 25 mg/dL   Creatinine 9.00 9.39 - 1.20 mg/dL   eGFR 69 >40 fO/fpw/8.26f7   Albumin 4.5 3.5 - 5.7 g/dL   Total Protein 6.8 6.4 - 8.9 g/dL   Bilirubin,  Total 1.3 (H) 0.3 - 1.0 mg/dL   Alkaline Phosphatase (ALP) 75 34 - 104 U/L   Aspartate Aminotransferase (AST) 27 13 - 39 U/L   Alanine Aminotransferase (ALT) 46 7 - 52 U/L   Calcium 9.6 8.6 - 10.3 mg/dL   BUN/Creatinine Ratio       OBJECTIVE   Allergies[1]  Current Medications[2]  Problem List[3]  Medical History[4]  Social History[5]  Surgical History[6]  Family History[7]  Review of Systems Systemic:  Not feeling tired or poorly. No fever or no chills. Head:  No headache. Eyes:  No vision problems. Otolaryngeal:  No earache and no sore throat. Cardiovascular:  No chest pain or discomfort and no palpitations. Pulmonary:  No dyspnea and no cough. Gastrointestinal:  No vomiting, no abdominal pain, and no melena. Genitourinary:  No dysuria. Musculoskeletal:  No localized joint pain and no localized joint swelling. Neurological:  No motor disturbances and no sensory disturbances. Psychological:  No anxiety and no depression. Skin:  No skin lesions or rash.  BP 118/80 (BP Location: Left arm, Patient Position: Sitting)   Pulse 79   Wt 105 kg (230 lb 9.6 oz)   SpO2 98%   BMI 39.58 kg/m   Physical Exam Constitutional: Oriented to person, place and time. Appears well-developed and well nourished. HEENT: Normocephalic, atraumatic.  Skin: Warm and dry. No rashes noted. Patient is not diaphoretic. Extremities: No edema bilaterally. No clubbing/cyanosis. Neuro:  CN grossly intact Psychiatric: Normal mood and affect. Behavior normal. Judgement and thought content normal.   ASSESSMENT/PLAN   Problem List Items Addressed This Visit     Familial hyperlipidemia   Relevant Medications   cholecalciferol (VITAMIN D3) 125 mcg (5,000 unit) capsule   Lactobacillus no.46/B.animalis (PROBIOTIC-10 ORAL)   rosuvastatin (CRESTOR) 20 mg tablet   Vitamin D  deficiency - Primary   Situational mixed anxiety and depressive disorder   Relevant Medications   escitalopram (LEXAPRO) 5 mg  tablet   cholecalciferol (VITAMIN D3) 125 mcg (5,000 unit) capsule   Lactobacillus no.46/B.animalis (PROBIOTIC-10 ORAL)   tirzepatide (MOUNJARO) 2.5 mg/0.5 mL subcutaneous pen injector   Morbid obesity (HCC)   Diabetes mellitus    (CMD)   Relevant Medications   tirzepatide (MOUNJARO) 2.5 mg/0.5 mL subcutaneous pen injector   Other Visit Diagnoses       Need for pneumococcal 20-valent conjugate vaccination       Relevant Orders   Pneumococcal Vaccine 20-Valent (PREVNAR-20) 6 wks+ (Completed)       Patient Instructions  1. Familial hyperlipidemia Recommend high intensity statin due to new diagnosis of diabetes.  Therefore will stop simvastatin  and switch to rosuvastatin 20 mg daily.  Follow up in 3 months with labs prior. -Healthy lifestyle including  routine exercise and compliance with low fat/low cholesterol and Mediterranean diet encouraged. -Patient instructed to exercise 30 minutes 5 times weekly.  - rosuvastatin (CRESTOR) 20 mg tablet; Take 1 tablet (20 mg total) by mouth nightly.  Dispense: 90 tablet; Refill: 0  2. Type 2 diabetes mellitus without complication, without long-term current use of insulin     (CMD) Patient is meeting goal of Hgb A1C < 6.5.  Continue with current regimen. If tolerating medication but weight loss plateaus can message us  in between visits and increase dose.  Follow up in 3 months.  -Patient instructed to exercise 30 minutes 5 times weekly.  - tirzepatide (MOUNJARO) 2.5 mg/0.5 mL subcutaneous pen injector; Inject 2.5 mg under the skin every 7 days.  Dispense: 2 mL; Refill: 2  3. Situational mixed anxiety and depressive disorder Controlled. Continue with current regimen.   - escitalopram (LEXAPRO) 5 mg tablet; Take 1 tablet (5 mg total) by mouth daily.  Dispense: 90 tablet; Refill: 1  4. Vitamin D  deficiency (Primary) Stable. Continue with current regimen.    5. Morbid obesity (HCC) Improving. Continue with Mounjaro.  Follow up in 3  months.  6. Need for pneumococcal 20-valent conjugate vaccination Given - Pneumococcal Vaccine 20-Valent (PREVNAR-20) 6 wks+   Return in about 3 months (around 11/19/2024) for Weight/Chol with labs prior. .  Orders Placed This Encounter  Procedures  . Pneumococcal Vaccine 20-Valent (PREVNAR-20) 6 wks+    Requested Prescriptions   Signed Prescriptions Disp Refills  . escitalopram (LEXAPRO) 5 mg tablet 90 tablet 1    Sig: Take 1 tablet (5 mg total) by mouth daily.  . tirzepatide (MOUNJARO) 2.5 mg/0.5 mL subcutaneous pen injector 2 mL 2    Sig: Inject 2.5 mg under the skin every 7 days.  . rosuvastatin (CRESTOR) 20 mg tablet 90 tablet 0    Sig: Take 1 tablet (20 mg total) by mouth nightly.    Risks, benefits, and alternatives of the medication(s) and treatment plan(s) were discussed, and she expressed understanding. Plan follow up as discussed or as needed. No barriers to treatment identified in this visit.    This document serves as a record of services personally performed by Ozell DOROTHA Lenis, MD. It was created on his behalf by Delon Moats Motsinger, CMA, a trained Medical Scribe. The creation of this record is the provider's dictation and/or activities during the visit.   Portions of this note were dictated using DRAGON voice recognition software. Please disregard any errors in transcription.         [1] No Known Allergies [2] Current Outpatient Medications  Medication Sig Dispense Refill  . cholecalciferol (VITAMIN D3) 125 mcg (5,000 unit) capsule Take 5,000 Units by mouth daily.    . collagen-biotin-ascorbic acid (Collagen 1500 Plus C) 500 mg-800 mcg- 50 mg cap Take 1 capsule by mouth daily. as directed    . ELDERBERRY FRUIT ORAL Take 3 capsules by mouth daily.    . Lactobacillus no.46/B.animalis (PROBIOTIC-10 ORAL) Take 1 capsule by mouth daily.    SABRA levothyroxine (SYNTHROID) 100 mcg tablet Take 1 tablet (100 mcg total) by mouth daily. 90 tablet 3  .  escitalopram (LEXAPRO) 5 mg tablet Take 1 tablet (5 mg total) by mouth daily. 90 tablet 1  . rosuvastatin (CRESTOR) 20 mg tablet Take 1 tablet (20 mg total) by mouth nightly. 90 tablet 0  . tirzepatide (MOUNJARO) 2.5 mg/0.5 mL subcutaneous pen injector Inject 2.5 mg under the skin every 7 days. 2 mL 2   No  current facility-administered medications for this visit.  [3] Patient Active Problem List Diagnosis  . Subclinical hypothyroidism  . Familial hyperlipidemia  . Vitamin D  deficiency  . Situational mixed anxiety and depressive disorder  . Morbid obesity (HCC)  . Gilbert syndrome  . Diabetes mellitus    (CMD)  [4] Past Medical History: Diagnosis Date  . Anxiety   . Familial hyperlipidemia   . Hyperglycemia   . Thyroid disease    Hypothyroidism  . Vitamin D  deficiency   [5] Social History Tobacco Use  . Smoking status: Never    Passive exposure: Never  . Smokeless tobacco: Never  Substance Use Topics  . Alcohol use: Yes    Comment: 2 drinks per month  . Drug use: Never  [6] Past Surgical History: Procedure Laterality Date  . KIDNEY STONE SURGERY  2018  . TOTAL ABDOMINAL HYSTERECTOMY  2017  [7] Family History Problem Relation Name Age of Onset  . Hypertension Mother    . Mental illness Mother    . Hyperlipidemia Mother    . Diabetes Father    . Heart disease Father    . Diabetes Maternal Grandmother    . Cancer Maternal Grandmother    . Breast cancer Maternal Grandmother    . Heart disease Maternal Grandfather    . Aortic aneurysm Neg Hx    . Colon cancer Neg Hx    . Osteoporosis Neg Hx

## 2024-08-27 ENCOUNTER — Other Ambulatory Visit: Payer: Self-pay

## 2024-08-27 ENCOUNTER — Emergency Department (HOSPITAL_BASED_OUTPATIENT_CLINIC_OR_DEPARTMENT_OTHER)
Admission: EM | Admit: 2024-08-27 | Discharge: 2024-08-27 | Disposition: A | Discharge status: Home / Self Care | Attending: Emergency Medicine | Admitting: Emergency Medicine

## 2024-08-27 ENCOUNTER — Encounter (HOSPITAL_BASED_OUTPATIENT_CLINIC_OR_DEPARTMENT_OTHER): Payer: Self-pay

## 2024-08-27 DIAGNOSIS — D72829 Elevated white blood cell count, unspecified: Secondary | ICD-10-CM | POA: Insufficient documentation

## 2024-08-27 DIAGNOSIS — K529 Noninfective gastroenteritis and colitis, unspecified: Secondary | ICD-10-CM | POA: Insufficient documentation

## 2024-08-27 DIAGNOSIS — E119 Type 2 diabetes mellitus without complications: Secondary | ICD-10-CM | POA: Insufficient documentation

## 2024-08-27 DIAGNOSIS — R1110 Vomiting, unspecified: Secondary | ICD-10-CM | POA: Diagnosis present

## 2024-08-27 HISTORY — DX: Type 2 diabetes mellitus without complications: E11.9

## 2024-08-27 LAB — COMPREHENSIVE METABOLIC PANEL WITH GFR
ALT: 50 U/L — ABNORMAL HIGH (ref 0–44)
AST: 39 U/L (ref 15–41)
Albumin: 4.5 g/dL (ref 3.5–5.0)
Alkaline Phosphatase: 90 U/L (ref 38–126)
Anion gap: 13 (ref 5–15)
BUN: 18 mg/dL (ref 6–20)
CO2: 24 mmol/L (ref 22–32)
Calcium: 9.7 mg/dL (ref 8.9–10.3)
Chloride: 104 mmol/L (ref 98–111)
Creatinine, Ser: 0.88 mg/dL (ref 0.44–1.00)
GFR, Estimated: >60 mL/min (ref >60)
Glucose, Bld: 92 mg/dL (ref 70–99)
Potassium: 4.7 mmol/L (ref 3.5–5.1)
Sodium: 142 mmol/L (ref 135–145)
Total Bilirubin: 0.9 mg/dL (ref 0.0–1.2)
Total Protein: 7.4 g/dL (ref 6.5–8.1)

## 2024-08-27 LAB — CBC
HCT: 47.7 % — ABNORMAL HIGH (ref 36.0–46.0)
Hemoglobin: 15.9 g/dL — ABNORMAL HIGH (ref 12.0–15.0)
MCH: 27.4 pg (ref 26.0–34.0)
MCHC: 33.3 g/dL (ref 30.0–36.0)
MCV: 82.1 fL (ref 80.0–100.0)
Platelets: 293 K/uL (ref 150–400)
RBC: 5.81 MIL/uL — ABNORMAL HIGH (ref 3.87–5.11)
RDW: 13.1 % (ref 11.5–15.5)
WBC: 12.2 K/uL — ABNORMAL HIGH (ref 4.0–10.5)
nRBC: 0 % (ref 0.0–0.2)

## 2024-08-27 LAB — LIPASE, BLOOD: Lipase: 30 U/L (ref 11–51)

## 2024-08-27 LAB — RESP PANEL BY RT-PCR (RSV, FLU A&B, COVID)  RVPGX2
Influenza A by PCR: NEGATIVE
Influenza B by PCR: NEGATIVE
Resp Syncytial Virus by PCR: NEGATIVE
SARS Coronavirus 2 by RT PCR: NEGATIVE

## 2024-08-27 LAB — GROUP A STREP BY PCR: Group A Strep by PCR: NOT DETECTED

## 2024-08-27 MED ORDER — DEXAMETHASONE SODIUM PHOSPHATE 10 MG/ML IJ SOLN
10.0000 mg | Freq: Once | INTRAMUSCULAR | Status: AC
  Administered 2024-08-27: 10 mg via INTRAVENOUS
  Filled 2024-08-27: qty 1

## 2024-08-27 MED ORDER — LACTATED RINGERS IV BOLUS
1000.0000 mL | Freq: Once | INTRAVENOUS | Status: AC
  Administered 2024-08-27: 1000 mL via INTRAVENOUS

## 2024-08-27 MED ORDER — KETOROLAC TROMETHAMINE 15 MG/ML IJ SOLN
15.0000 mg | Freq: Once | INTRAMUSCULAR | Status: AC
  Administered 2024-08-27: 15 mg via INTRAVENOUS
  Filled 2024-08-27: qty 1

## 2024-08-27 MED ORDER — PROCHLORPERAZINE EDISYLATE 10 MG/2ML IJ SOLN
10.0000 mg | Freq: Once | INTRAMUSCULAR | Status: AC
  Administered 2024-08-27: 10 mg via INTRAVENOUS
  Filled 2024-08-27: qty 2

## 2024-08-27 NOTE — ED Triage Notes (Addendum)
 Pt states she started yesterday with a slight sore throat. Now this am, pt has been having vomiting and diarrhea every 2 hours. Pt took a zofran , no HA, no intake.   Pt started Monjaro at the beginning of August. Felt nauseated in the beginning, this feels different.

## 2024-08-27 NOTE — Discharge Instructions (Addendum)
 You appear to have a stomach bug. Symptoms typically resolve on their own within the next 2-3 days. Please eat a bland diet with foods such as rice, toast, crackers, and then advance your diet as tolerated.  You may continue to take your home Zofran  (ondansetron ) as prescribed for nausea and vomiting.   You may take up to 1000mg  of tylenol  every 6 hours as needed for pain or fever.  Do not take more then 4g per day.  You may use up to 600mg  ibuprofen  every 6 hours as needed for pain or fever.  Do not exceed 2.4g of ibuprofen  per day.  Your workup appears reassuring today.  Your kidney, liver, and pancreas labs are normal.  Your electrolytes were normal.  Your COVID, flu, and RSV testing was negative.  Your strep testing was negative.  Your white blood cell count was slightly high, but this is expected with your illness.   Follow-up instructions: Please follow-up with your primary care provider for further evaluation of your symptoms if you are not feeling better within the next 3 days.   Return instructions:  Please return to the Emergency Department if you experience worsening symptoms.  Please return if you have persistent vomiting and cannot keep down fluids, you develop severe abdominal pain, you develop a fever that is not controlled by tylenol  or motrin .   Please return if you have any other emergent concerns.

## 2024-08-27 NOTE — ED Provider Notes (Signed)
 [facility] Provider Note   CSN: [id number] Arrival date & time: 08/27/24  1346     Patient presents with: Emesis   Diana Long is a 53 y.o. female with history of type 2 diabetes on Mounjaro, presents with concerns for a sore throat that started last night.  She is still able to swallow, just reports some pain.  She reports that she also developed vomiting and diarrhea after the sore throat started.  She reports she was vomiting up food she ate.  She is unsure what the diarrhea looked like.  Denies any abdominal pain.  Denies fever, chills, cough.  Reports a frontal headache that started last night.  Denies any head trauma.  Denies any vision changes, neck pain or stiffness.  She states she has not had any dose changes of her Mounjaro recently.  Denies any recent travel, recent hospitalizations, or any recent antibiotic use.  She took Zofran  prior to arrival which she reports helped with her nausea and vomiting.     Emesis      Prior to Admission medications   Medication Sig Start Date End Date Taking? Authorizing Provider  ELDERBERRY PO Take 100 mg by mouth.    [provider]  estradiol  (VIVELLE -DOT) 0.05 MG/24HR patch Place 1 patch (0.05 mg total) onto the skin 2 (two) times a week. 01/17/17   Eloy Herring, MD  levothyroxine (SYNTHROID) 100 MCG tablet Take 100 mcg by mouth.     [provider]  Multiple Vitamin (MULTIVITAMIN) tablet Take 1 tablet by mouth daily.    [provider]  simvastatin  (ZOCOR ) 20 MG tablet Take 1 tablet (20 mg total) by mouth daily at 6 PM. 06/01/20   [place] Adelita PENNER, MD  Vitamin D , Ergocalciferol , (DRISDOL ) 1.25 MG (50000 UNIT) CAPS capsule TAKE 1 CAPSULE (50,000 UNITS TOTAL) BY MOUTH EVERY 7 (SEVEN) DAYS. 10/12/20   Danford, Katy D, NP    Allergies: Patient has no known allergies.    Review of Systems  Gastrointestinal:  Positive for vomiting.    Updated Vital  Signs BP 128/79   Pulse 96   Temp 99.1 F (37.3 C)   Resp 18   Ht 5' 4 (1.626 m)   Wt 103 kg   LMP 11/11/2016   SpO2 97%   BMI 38.96 kg/m   Physical Exam Vitals and nursing note reviewed.  Constitutional:      General: She is not in acute distress.    Appearance: She is well-developed.     Comments: Well-appearing, no active vomiting  HENT:     Head: Normocephalic and atraumatic.     Mouth/Throat:     Pharynx: No oropharyngeal exudate or posterior oropharyngeal erythema.  Eyes:     Extraocular Movements: Extraocular movements intact.     Conjunctiva/sclera: Conjunctivae normal.     Pupils: Pupils are equal, round, and reactive to light.  Cardiovascular:     Rate and Rhythm: Normal rate and regular rhythm.     Heart sounds: No murmur heard. Pulmonary:     Effort: Pulmonary effort is normal. No respiratory distress.     Breath sounds: Normal breath sounds.  Abdominal:     Palpations: Abdomen is soft.     Tenderness: There is no abdominal tenderness.  Musculoskeletal:        General: No swelling.     Cervical back: Neck supple.  Lymphadenopathy:     Cervical: No cervical adenopathy.  Skin:  General: Skin is warm and dry.     Capillary Refill: Capillary refill takes less than 2 seconds.  Neurological:     Mental Status: She is alert.  Psychiatric:        Mood and Affect: Mood normal.     (all labs ordered are listed, but only abnormal results are displayed) Labs Reviewed  COMPREHENSIVE METABOLIC PANEL WITH GFR - Abnormal; Notable for the following components:      Result Value   ALT 50 (*)    All other components within normal limits  CBC - Abnormal; Notable for the following components:   WBC 12.2 (*)    RBC 5.81 (*)    Hemoglobin 15.9 (*)    HCT 47.7 (*)    All other components within normal limits  RESP PANEL BY RT-PCR (RSV, FLU A&B, COVID)  [id number]  GROUP A STREP BY PCR  LIPASE, BLOOD    EKG: None  Radiology: No results found.   Procedures    Medications Ordered in the ED  ketorolac  (TORADOL ) 15 MG/ML injection 15 mg (15 mg Intravenous Given 08/27/24 1532)  prochlorperazine  (COMPAZINE ) injection 10 mg (10 mg Intravenous Given 08/27/24 1532)  lactated ringers  bolus 1,000 mL (1,000 mLs Intravenous New Bag/Given 08/27/24 1538)  dexamethasone  (DECADRON ) injection 10 mg (10 mg Intravenous Given 08/27/24 1532)                                    Medical Decision Making Amount and/or Complexity of Data Reviewed Labs: ordered.  Risk Prescription drug management.     Differential diagnosis includes but is not limited to acute cholecystitis, cholelithiasis, cholangitis, choledocholithiasis, peptic ulcer, gastritis, gastroenteritis, appendicitis, IBS, IBD, DKA, nephrolithiasis, UTI, pyelonephritis, pancreatitis, diverticulitis, mesenteric ischemia, abdominal aortic aneurysm, small bowel obstruction, volvulus, Mounjaro side effect   ED Course:  Upon initial evaluation, patient is well-appearing, stable vital signs.  No active vomiting.  She did take Zofran  prior to arrival which did reportedly help with her nausea and vomiting.  Abdomen is soft and nontender to palpation.  Posterior oropharynx without significant erythema, edema, no tonsillar exudate noted.  She is swallowing secretions without difficulty.  Obtain basic labs.  Since patient still is reporting some nausea, sore throat, and frontal headache, will give LR bolus, Compazine , Toradol , Decadron .  Labs Ordered: I Ordered, and personally interpreted labs.  The pertinent results include:   CBC with leukocytosis of 12.2, hemoglobin elevated at 15.9 CMP with mildly elevated ALT at 50, no elevations in AST, alk phos, T. bili. No elevations in creatinine, no electrolyte abnormalities Lipase within normal limits Strep, COVID, flu, RSV negative  Medications Given: Decadron  Toradol  Compazine  LR bolus  Upon re-evaluation, patient remains well appearing with stable vitals. Has not  had any episodes of emesis here.   CBC with leukocytosis of 12.2, but no tachycardia, fever, or other signs of systemic infection.  Her CMP is without any elevation of LFT evidence, no elevation in creatinine.  Normal electrolytes.  Lipase within normal limits.  Her COVID, flu, RSV, and strep testing is negative. Given abdomen is soft and non-tender, stable vitals, reassuring labs, low concern for acute intra-abdominal concern that would need any further evaluation such as CT abdomen and pelvis scan at this time. Given sudden onset of symptoms, suspect viral gastroenteritis.  She does not have any risk factors for C difficile (no recent antibiotic use, no recent hospitalizations). She denies any bloody  stools, no fevers, no foreign travel, no concern for infectious etiology to diarrhea that would require treatment with antibiotics.  She was p.o. challenged here with ginger ale and tolerated this well.  She reports her headache has resolved.  No concern for meningitis as she does not have any nuchal rigidity, no fevers.  Stable and appropriate for discharge home this time.    Impression: Viral Gastroenteritis  Disposition:  The patient was discharged home with instructions to take her home Zofran  as prescribed.  She says she does not need any further refills of this medication. Keep well hydrated with water and electrolyte drink such as Pedialyte at home.  Follow-up with PCP if symptoms not improving within the next 3 days. Return precautions given.      This chart was dictated using voice recognition software, Dragon. Despite the best efforts of this provider to proofread and correct errors, errors may still occur which can change documentation meaning.       Final diagnoses:  Gastroenteritis    ED Discharge Orders     None          Veta Palma, [place] 08/27/24 1651    Long, Fonda MATSU, MD 08/27/24 [phone]
